# Patient Record
Sex: Female | Born: 1990 | Race: White | Hispanic: Yes | Smoking: Never smoker
Health system: Southern US, Community
[De-identification: ages and names within clinical notes are randomized; demographics above are authoritative.]

## PROBLEM LIST (undated history)

## (undated) DIAGNOSIS — E611 Iron deficiency: Secondary | ICD-10-CM

## (undated) DIAGNOSIS — J40 Bronchitis, not specified as acute or chronic: Secondary | ICD-10-CM

## (undated) DIAGNOSIS — H6993 Unspecified Eustachian tube disorder, bilateral: Secondary | ICD-10-CM

## (undated) DIAGNOSIS — L299 Pruritus, unspecified: Secondary | ICD-10-CM

## (undated) DIAGNOSIS — R5382 Chronic fatigue, unspecified: Secondary | ICD-10-CM

## (undated) DIAGNOSIS — F909 Attention-deficit hyperactivity disorder, unspecified type: Secondary | ICD-10-CM

## (undated) DIAGNOSIS — D899 Disorder involving the immune mechanism, unspecified: Secondary | ICD-10-CM

## (undated) DIAGNOSIS — M419 Scoliosis, unspecified: Secondary | ICD-10-CM

## (undated) DIAGNOSIS — G4700 Insomnia, unspecified: Secondary | ICD-10-CM

## (undated) DIAGNOSIS — I67848 Other cerebrovascular vasospasm and vasoconstriction: Secondary | ICD-10-CM

## (undated) DIAGNOSIS — Z789 Other specified health status: Secondary | ICD-10-CM

## (undated) DIAGNOSIS — K900 Celiac disease: Secondary | ICD-10-CM

## (undated) HISTORY — DX: Pruritus, unspecified: L29.9

## (undated) HISTORY — DX: Iron deficiency: E61.1

## (undated) HISTORY — DX: Insomnia, unspecified: G47.00

## (undated) HISTORY — DX: Other cerebrovascular vasospasm and vasoconstriction: I67.848

## (undated) HISTORY — DX: Attention-deficit hyperactivity disorder, unspecified type: F90.9

## (undated) HISTORY — DX: Chronic fatigue, unspecified: R53.82

## (undated) HISTORY — DX: Disorder involving the immune mechanism, unspecified: D89.9

## (undated) HISTORY — DX: Bronchitis, not specified as acute or chronic: J40

## (undated) HISTORY — DX: Other specified health status: Z78.9

## (undated) HISTORY — DX: Unspecified eustachian tube disorder, bilateral: H69.93

## (undated) HISTORY — DX: Scoliosis, unspecified: M41.9

## (undated) HISTORY — DX: Celiac disease: K90.0

---
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
## 2010-05-21 HISTORY — PX: WISDOM TOOTH EXTRACTION: SHX21

## 2018-04-01 ENCOUNTER — Encounter: Payer: Self-pay | Admitting: Internal Medicine

## 2018-04-01 ENCOUNTER — Ambulatory Visit (INDEPENDENT_AMBULATORY_CARE_PROVIDER_SITE_OTHER): Payer: 59 | Admitting: Internal Medicine

## 2018-04-01 VITALS — BP 128/60 | HR 89 | Ht 62.0 in | Wt 121.0 lb

## 2018-04-01 DIAGNOSIS — J45991 Cough variant asthma: Secondary | ICD-10-CM | POA: Diagnosis not present

## 2018-04-01 MED ORDER — PROMETHAZINE-CODEINE 6.25-10 MG/5ML PO SYRP: 5.0000 mL | ORAL_SOLUTION | ORAL | 0 refills | Status: DC | PRN

## 2018-04-01 MED ORDER — ACETAMINOPHEN-CODEINE #3 300-30 MG PO TABS: 1.0000 | ORAL_TABLET | ORAL | 0 refills | Status: DC | PRN

## 2018-04-01 MED ORDER — MOMETASONE FURO-FORMOTEROL FUM 100-5 MCG/ACT IN AERO: 2.0000 | INHALATION_SPRAY | Freq: Two times a day (BID) | RESPIRATORY_TRACT | 0 refills | Status: DC

## 2018-04-01 MED ORDER — FLUTTER DEVI: 0 refills | Status: DC

## 2018-04-01 MED ORDER — MOMETASONE FURO-FORMOTEROL FUM 100-5 MCG/ACT IN AERO: 2.0000 | INHALATION_SPRAY | Freq: Two times a day (BID) | RESPIRATORY_TRACT | 11 refills | Status: DC

## 2018-04-01 NOTE — Patient Instructions (Addendum)
For cough > mucinex dm 1200 mg max dose (or Delsym)  with flutter valve  If still can't stop and you can take 3 days off>>> promethazine with codeine   up to 1-2 tsp  every 4 hours to suppress the urge to cough. Swallowing water and/or using ice chips/non mint and menthol containing candies (such as lifesavers or sugarless jolly ranchers) are also effective.  You should rest your voice and avoid activities that you know make you cough.  Once you have eliminated the cough for 3 straight days stopping the promethazine and using delsym instead   Dulera 100 Take 2 puffs first thing in am and then another 2 puffs about 12 hours later   GERD (REFLUX)  is an extremely common cause of respiratory symptoms just like yours , many times with no obvious heartburn at all.    It can be treated with medication, but also with lifestyle changes including elevation of the head of your bed (ideally with 6 inch  bed blocks),  Smoking cessation, avoidance of late meals, excessive alcohol, and avoid fatty foods, chocolate, peppermint, colas, red wine, and acidic juices such as orange juice.  NO MINT OR MENTHOL PRODUCTS SO NO COUGH DROPS   USE SUGARLESS CANDY INSTEAD (Jolley ranchers or Stover's or Life Savers) or even ice chips will also do - the key is to swallow to prevent all throat clearing. NO OIL BASED VITAMINS - use powdered substitutes.   Please schedule a follow up office visit in 2 weeks, sooner if needed  with all medications /inhalers/ solutions in hand so we can verify exactly what you are taking. This includes all medications from all doctors and over the counters
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
## 2018-04-15 ENCOUNTER — Encounter: Payer: Self-pay | Admitting: Internal Medicine

## 2018-04-15 ENCOUNTER — Ambulatory Visit (INDEPENDENT_AMBULATORY_CARE_PROVIDER_SITE_OTHER): Payer: 59 | Admitting: Internal Medicine

## 2018-04-15 VITALS — BP 116/70 | HR 72 | Ht 62.0 in | Wt 120.2 lb

## 2018-04-15 DIAGNOSIS — J45991 Cough variant asthma: Secondary | ICD-10-CM

## 2018-04-15 NOTE — Patient Instructions (Addendum)
Come to Outpatient Registration at [facility] (behind the ER) at 79 am Tuesday  Dec 3rd with nothing to eat or drink after midnight Monday  for Bronchoscopy  Add:  Check next ov re use of codeine cough meds ? 3 day cycle completed?
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
## 2018-04-16 ENCOUNTER — Encounter: Payer: Self-pay | Admitting: Internal Medicine

## 2018-04-16 ENCOUNTER — Telehealth: Payer: Self-pay | Admitting: Internal Medicine

## 2018-04-16 NOTE — Telephone Encounter (Signed)
Called patient, unable to reach left message to give us a call back. 

## 2018-04-16 NOTE — Telephone Encounter (Signed)
Called and spoke with Baxter Flattery, she stated that patient has decided to cancel the bronch as she has no transportation. Patient did not want to reschedule at this time. Will route to MW as an Pharmacist, hospital.
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
## 2018-04-16 NOTE — Telephone Encounter (Signed)
Let pt know that if that's the case I'm happy to finish the work up with sinus CT and HRCT chest but if declines this then refer to [place] allergy eval and we'll see back here prn with all active meds in hand

## 2018-04-18 NOTE — Telephone Encounter (Signed)
Attempted to call pt but line went straight to VM. Left message for pt to return call.

## 2018-04-21 NOTE — Telephone Encounter (Signed)
Attempted to call pt but unable to reach her and unable to leave a VM due to mailbox being full. Will try to cal back later.

## 2018-04-21 NOTE — Telephone Encounter (Signed)
Patient returned call, CB is [phone]

## 2018-04-21 NOTE — Telephone Encounter (Signed)
atc pt, mailbox full.  Wcb.

## 2018-04-22 ENCOUNTER — Ambulatory Visit (HOSPITAL_COMMUNITY)
Admission: RE | Admit: 2018-04-22 | Payer: BLUE CROSS/BLUE SHIELD | Source: Ambulatory Visit | Admitting: Internal Medicine

## 2018-04-22 ENCOUNTER — Encounter (HOSPITAL_COMMUNITY): Payer: BLUE CROSS/BLUE SHIELD

## 2018-04-22 ENCOUNTER — Encounter (HOSPITAL_COMMUNITY): Admission: RE | Payer: Self-pay | Source: Ambulatory Visit

## 2018-04-22 SURGERY — VIDEO BRONCHOSCOPY WITHOUT FLUORO
Anesthesia: Moderate Sedation | Laterality: Bilateral

## 2018-04-22 NOTE — Telephone Encounter (Signed)
Spoke with patient-she states she told our office already that she did not want to cancel the bronch all together-she would just need to [facility] it when she had family in town so they could take her. Pt understands to contact our office once she has family in town and we will work with her to get bronch scheduled. Pt will wait to due CT Sinus and HRCT Chest until after bronch if still needed. Pt is aware that I will send signed message to MW as Juluis Rainier.

## 2019-02-16 ENCOUNTER — Other Ambulatory Visit: Payer: Self-pay

## 2019-02-16 DIAGNOSIS — Z20822 Contact with and (suspected) exposure to covid-19: Secondary | ICD-10-CM

## 2019-02-17 LAB — NOVEL CORONAVIRUS, NAA: SARS-CoV-2, NAA: NOT DETECTED

## 2019-05-20 ENCOUNTER — Other Ambulatory Visit: Payer: 59

## 2019-07-01 ENCOUNTER — Ambulatory Visit: Payer: 59 | Attending: Internal Medicine

## 2019-07-01 ENCOUNTER — Ambulatory Visit: Payer: 59

## 2019-07-01 DIAGNOSIS — Z20822 Contact with and (suspected) exposure to covid-19: Secondary | ICD-10-CM

## 2019-07-02 LAB — NOVEL CORONAVIRUS, NAA: SARS-CoV-2, NAA: NOT DETECTED

## 2019-07-29 ENCOUNTER — Ambulatory Visit: Payer: 59 | Attending: Internal Medicine

## 2019-07-29 DIAGNOSIS — Z20822 Contact with and (suspected) exposure to covid-19: Secondary | ICD-10-CM

## 2019-07-30 LAB — NOVEL CORONAVIRUS, NAA: SARS-CoV-2, NAA: NOT DETECTED

## 2019-08-12 ENCOUNTER — Ambulatory Visit: Payer: 59

## 2019-08-12 ENCOUNTER — Ambulatory Visit: Payer: 59 | Attending: Internal Medicine

## 2019-08-12 DIAGNOSIS — Z20822 Contact with and (suspected) exposure to covid-19: Secondary | ICD-10-CM

## 2019-08-13 LAB — SARS-COV-2, NAA 2 DAY TAT

## 2019-08-13 LAB — NOVEL CORONAVIRUS, NAA: SARS-CoV-2, NAA: NOT DETECTED

## 2019-08-17 ENCOUNTER — Other Ambulatory Visit: Payer: Self-pay | Admitting: Family Medicine

## 2019-08-17 DIAGNOSIS — Z8639 Personal history of other endocrine, nutritional and metabolic disease: Secondary | ICD-10-CM

## 2019-09-01 ENCOUNTER — Other Ambulatory Visit: Payer: 59

## 2019-09-01 ENCOUNTER — Telehealth: Payer: Self-pay | Admitting: Hematology

## 2019-09-01 NOTE — Telephone Encounter (Signed)
Received a new hem referral from Christa See, NP at [facility] for evaluate for iron infusions for low ferritin level. MS. [place]ust has been cld and scheduled to see Dr. Irene Limbo on 5/4 at 1pm. Pt aware to arrive 15 minutes early.

## 2019-09-01 NOTE — Telephone Encounter (Signed)
Created in error

## 2019-09-07 ENCOUNTER — Other Ambulatory Visit: Payer: 59

## 2019-09-08 ENCOUNTER — Ambulatory Visit
Admission: RE | Admit: 2019-09-08 | Discharge: 2019-09-08 | Disposition: A | Discharge status: Home / Self Care | Payer: 59 | Source: Ambulatory Visit | Attending: Family Medicine | Admitting: Family Medicine

## 2019-09-08 DIAGNOSIS — Z8639 Personal history of other endocrine, nutritional and metabolic disease: Secondary | ICD-10-CM

## 2019-09-16 ENCOUNTER — Encounter: Payer: Self-pay | Admitting: *Deleted

## 2019-09-16 ENCOUNTER — Other Ambulatory Visit: Payer: Self-pay | Admitting: *Deleted

## 2019-09-21 ENCOUNTER — Ambulatory Visit: Payer: 59 | Admitting: Diagnostic Neuroimaging

## 2019-09-21 ENCOUNTER — Other Ambulatory Visit: Payer: Self-pay

## 2019-09-21 ENCOUNTER — Encounter: Payer: Self-pay | Admitting: *Deleted

## 2019-09-21 VITALS — BP 104/67 | HR 84 | Temp 97.5°F | Ht 62.0 in | Wt 129.0 lb

## 2019-09-21 DIAGNOSIS — I679 Cerebrovascular disease, unspecified: Secondary | ICD-10-CM

## 2019-09-21 DIAGNOSIS — I6781 Acute cerebrovascular insufficiency: Secondary | ICD-10-CM | POA: Diagnosis not present

## 2019-09-21 NOTE — Progress Notes (Signed)
[facility]  PATIENT: Lori Mccarty DOB: April 29, 1991  REFERRING CLINICIAN: Christa See, FNP HISTORY FROM: patient  REASON FOR VISIT: new consult    HISTORICAL  CHIEF COMPLAINT:  Chief Complaint  Patient presents with  . 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  She is seen a number of academic neurology specialists and had extensive work-up thus far.  Patient now living in [place] and looking to establish with a local neurologist.  2006 patient had a head injury and concussion during cheerleading event where she was struck in the head by another teammate.  Patient lost consciousness, had dizziness, weakness, pain.  She developed cognitive difficulties, anxiety, fatigue and pain.  In spite of these difficulties, patient had to relearn and overcome the symptoms.  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  Around 2017 patient started to have new types of symptoms where she would have sudden fatigue and generalized weakness, difficulty walking, typically triggered by exertion, head position, body position, alcohol, eating.  Her systemic blood pressure would not change that much during these events.  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  She was treated conservatively with increased fluid intake, salt, compression stockings, trial of Mestinon which she could not tolerate, trial of Florinef which she takes intermittently now.  She also had MRI, MRA of the head and neck which were normal.   Skin biopsy demonstrated possible small fiber neuropathy.  EMG was normal.  EEG was normal.  Patient also having trouble with her sleep cycle which diagnosed as inverted.  She is tried variety of sleep aids including Ambien, trazodone, Cymbalta, Lyrica, buspirone, benzodiazepines without relief.  Currently on trazodone.  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    REVIEW OF SYSTEMS: Full 14 system review of systems performed and negative with exception of: Insomnia sleepiness restlessness tremor anxiety not of sleep allergy sensitivity diarrhea constipation ringing ears spinning sensation chest pain palpitation murmur weight gain fatigue feeling hot feeling cold flushing rash.  ALLERGIES: Allergies  Allergen Reactions  . Sulfa Antibiotics Swelling    Swelling around eye/tempral area  . Dulera [Mometasone Furo-Formoterol Fum]     Shaky feeling, almost jittery 10 minutes after taking.  . Gluten Meal Other (See Comments)    celiac disease  . Mestinon [Pyridostigmine] Other (See Comments)    GI issues  . Penicillins Rash    Has patient had a PCN reaction causing immediate rash, facial/tongue/throat swelling, SOB or lightheadedness with hypotension: Yes Has patient had a PCN reaction causing severe rash involving mucus membranes or skin necrosis: No Has patient had a PCN reaction that required hospitalization: No Has patient had a PCN reaction occurring within the last 10 years: No Infantile reaction. If all of the above answers are "NO", then may proceed with Cephalosporin use.     HOME MEDICATIONS: Outpatient Medications Prior to Visit  Medication Sig Dispense Refill  . albuterol (PROVENTIL HFA;VENTOLIN HFA) 108 (90 Base) MCG/ACT inhaler Inhale 2 puffs into the lungs every 6 (six) hours as needed for wheezing or shortness of breath.    Marland Kitchen  B Complex-C (B-COMPLEX WITH VITAMIN C) tablet Take 1 tablet by mouth 3 (three) times a week.      . budesonide-formoterol (SYMBICORT) 80-4.5 MCG/ACT inhaler Inhale into the lungs as needed.    . carboxymethylcellulose (REFRESH PLUS) 0.5 % SOLN Place 1 drop into both eyes 3 (three) times daily as needed (dry/irritated eyes.).    Marland Kitchen Cholecalciferol (VITAMIN D3) 125 MCG (5000 UT) TABS Take 5,000 Units by mouth 3 (three) times a week.    . fludrocortisone (FLORINEF) 0.1 MG tablet 1 tablet daily.    . hydroxychloroquine (PLAQUENIL) 200 MG tablet as needed. 09/21/19 not taking    . Iron-FA-B Cmp-C-Biot-Probiotic (FUSION PLUS) CAPS Take 1 capsule by mouth daily.    Marland Kitchen Respiratory Therapy Supplies (FLUTTER) DEVI Use as directed 1 each 0  . traZODone (DESYREL) 50 MG tablet Take 100 mg by mouth at bedtime.     Marland Kitchen ivermectin (STROMECTOL) 3 MG TABS tablet as needed.    . metroNIDAZOLE (METROGEL) 0.75 % vaginal gel     . MELATONIN PO Take by mouth.    . MELATONIN-GABA-VALERIAN PO Take 1 capsule by mouth at bedtime as needed (sleep).     No facility-administered medications prior to visit.    PAST MEDICAL HISTORY: Past Medical History:  Diagnosis Date  . Celiac disease   . Cerebral vasospasm   . Chronic fatigue   . Eustachian tube disorder, bilateral   . Hyperkinetic    gall bladder  . 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 Insomnia   . Iron deficiency   . Pruritic condition   . Scoliosis    from injury  . TBI (traumatic brain injury) [facility]) 2006   cheerleading accident  . Vegetarian diet     PAST SURGICAL HISTORY: Past Surgical History:  Procedure Laterality Date  . WISDOM TOOTH EXTRACTION  2012    FAMILY HISTORY: Family History  Problem Relation Age of Onset  . Hypertension Father   . CAD Maternal Grandmother   . Lung cancer Maternal Grandmother   . Bladder Cancer Maternal Grandmother   . Diabetes Maternal Grandmother   . Parkinson's disease Maternal Grandmother   . Colon cancer Maternal Grandfather   . Heart attack Maternal Grandfather   . Hypertension  Maternal Grandfather   . Dementia Paternal Grandmother     SOCIAL HISTORY: Social History   Socioeconomic History  . Marital status: Single    Spouse name: Not on file  . Number of children: 0  . Years of education: Not on file  . Highest education level: Professional school degree (e.g., MD, DDS, DVM, JD)  Occupational History    Comment: Vet  Tobacco Use  . Smoking status: Never Smoker  . Smokeless tobacco: Never Used  Substance and Sexual Activity  . Alcohol use: Yes    Comment: 2/weekly  . Drug use: Not Currently  . Sexual activity: Not on file  Other Topics Concern  . Not on file  Social History Narrative   Lives with partner   No caffeine   Social Determinants of Health   Financial Resource Strain:   . Difficulty of Paying Living Expenses:   Food Insecurity:   . Worried About Charity fundraiser in the Last Year:   . Arboriculturist in the Last Year:   Transportation Needs:   . Film/video editor (Medical):   Marland Kitchen Lack of Transportation (Non-Medical):   Physical Activity:   . Days of Exercise per Week:   .  Minutes of Exercise per Session:   Stress:   . Feeling of Stress :   Social Connections:   . Frequency of Communication with Friends and Family:   . Frequency of Social Gatherings with Friends and Family:   . Attends Religious Services:   . Active Member of Clubs or Organizations:   . Attends Archivist Meetings:   Marland Kitchen Marital Status:   Intimate Partner Violence:   . Fear of Current or Ex-Partner:   . Emotionally Abused:   Marland Kitchen Physically Abused:   . Sexually Abused:      PHYSICAL EXAM  GENERAL EXAM/CONSTITUTIONAL: Vitals:  Vitals:   09/21/19 1125  BP: 104/67  Pulse: 84  Temp: (!) 97.5 F (36.4 C)  Weight: 129 lb (58.5 kg)  Height: 5' 2"  (1.575 m)     Body mass index is 23.59 kg/m. Wt Readings from Last 3 Encounters:  09/21/19 129 lb (58.5 kg)  04/15/18 120 lb 3.2 oz (54.5 kg)  04/01/18 121 lb (54.9 kg)     Patient is in  no distress; well developed, nourished and groomed; neck is supple  CARDIOVASCULAR:  Examination of carotid arteries is normal; no carotid bruits  Regular rate and rhythm, no murmurs  Examination of peripheral vascular system by observation and palpation is normal  EYES:  Ophthalmoscopic exam of optic discs and posterior segments is normal; no papilledema or hemorrhages  No exam data present  MUSCULOSKELETAL:  Gait, strength, tone, movements noted in Neurologic exam below  NEUROLOGIC: MENTAL STATUS:  No flowsheet data found.  awake, alert, oriented to person, place and time  recent and remote memory intact  normal attention and concentration  language fluent, comprehension intact, naming intact  fund of knowledge appropriate  CRANIAL NERVE:   2nd - no papilledema on fundoscopic exam  2nd, 3rd, 4th, 6th - pupils equal and reactive to light, visual fields full to confrontation, extraocular muscles intact, no nystagmus  5th - facial sensation symmetric  7th - facial strength symmetric  8th - hearing intact  9th - palate elevates symmetrically, uvula midline  11th - shoulder shrug symmetric  12th - tongue protrusion midline  MOTOR:   normal bulk and tone, full strength in the BUE, BLE  SENSORY:   normal and symmetric to light touch, temperature, vibration  COORDINATION:   finger-nose-finger, fine finger movements normal  REFLEXES:   deep tendon reflexes present and symmetric  GAIT/STATION:   narrow based gait     DIAGNOSTIC DATA (LABS, IMAGING, TESTING) - I reviewed patient records, labs, notes, testing and imaging myself where available.  No results found for: WBC, HGB, HCT, MCV, PLT No results found for: NA, K, CL, CO2, GLUCOSE, BUN, CREATININE, CALCIUM, PROT, ALBUMIN, AST, ALT, ALKPHOS, BILITOT, GFRNONAA, GFRAA No results found for: CHOL, HDL, LDLCALC, LDLDIRECT, TRIG, CHOLHDL No results found for: HGBA1C No results found for:  VITAMINB12 No results found for: TSH   05/26/15 MRI cervical spine  1. STRAIGHTENING OF THE CERVICAL LORDOSIS. 2. OTHERWISE NEGATIVE EVALUATION. NO HERNIATED DISC, SPINAL STENOSIS, INTRINSIC CORD ABNORMALITY OR BONY LESIONS ARE PRESENT AT ANY LEVEL.  06/08/16 MRI brain [report only] - mild frontal atrophy  09/28/16 MRA head / neck - normal  09/04/16 autonomic testing - qSART normal - heart rate response normal - head up tilt study normal - left MCA velocity decreased during tilt time; no change in systemic BP; right MCA normal  06/26/19 autonomic testing -Small fiber neuropathy, mild, affecting sudomotor autonomic fibers (epidermal nerve fiber density  was normal; sweat gland nerve fiber density mildly abnormal decreased by 11%) -Sympathetic adrenergic dysfunction; no evidence of orthostatic hypotension or POTS -Orthostatic cerebral blood flow velocities reduced, largely due to effect of hypocapnia, common and orthostatic intolerance syndromes -Intermittent hypocapnic hyperventilation in supine and during tilt.    ASSESSMENT AND PLAN  29 y.o. year old female here with intermittent episodes of dizziness, lightheadedness, fatigue, sleep disturbance.  Patient is seen several academic neurology specialists and been diagnosed with a cerebral hyperperfusion syndrome of unclear etiology.  Of note patient has had major concussion in 2006.  Dx:  1. Cerebral hypoperfusion      PLAN:  - check MRI brain (due to worsening symptoms) - consider sleep consult (inverted sleep cycle; insomnia)  Orders Placed This Encounter  Procedures  . MR BRAIN W WO CONTRAST   Return for pending if symptoms worsen or fail to improve.    Penni Bombard, MD 08/22/345, 42:59 AM Certified in Neurology, Neurophysiology and Neuroimaging  [facility] [address], [place],  [address] [phone]
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
## 2019-09-22 ENCOUNTER — Other Ambulatory Visit: Payer: Self-pay

## 2019-09-22 ENCOUNTER — Telehealth: Payer: Self-pay | Admitting: Hematology

## 2019-09-22 ENCOUNTER — Inpatient Hospital Stay: Payer: 59 | Attending: Hematology | Admitting: Hematology

## 2019-09-22 ENCOUNTER — Inpatient Hospital Stay: Payer: 59

## 2019-09-22 VITALS — BP 106/65 | HR 73 | Temp 99.1°F | Resp 18 | Ht 62.0 in | Wt 128.5 lb

## 2019-09-22 DIAGNOSIS — D509 Iron deficiency anemia, unspecified: Secondary | ICD-10-CM | POA: Diagnosis present

## 2019-09-22 DIAGNOSIS — Z801 Family history of malignant neoplasm of trachea, bronchus and lung: Secondary | ICD-10-CM | POA: Insufficient documentation

## 2019-09-22 DIAGNOSIS — Z862 Personal history of diseases of the blood and blood-forming organs and certain disorders involving the immune mechanism: Secondary | ICD-10-CM | POA: Diagnosis not present

## 2019-09-22 DIAGNOSIS — Z8349 Family history of other endocrine, nutritional and metabolic diseases: Secondary | ICD-10-CM | POA: Insufficient documentation

## 2019-09-22 DIAGNOSIS — Z8052 Family history of malignant neoplasm of bladder: Secondary | ICD-10-CM | POA: Diagnosis not present

## 2019-09-22 DIAGNOSIS — Z800 Family history of malignant neoplasm of digestive organs: Secondary | ICD-10-CM | POA: Insufficient documentation

## 2019-09-22 LAB — CMP (CANCER CENTER ONLY)
ALT: 15 U/L (ref 0–44)
AST: 21 U/L (ref 15–41)
Albumin: 4.3 g/dL (ref 3.5–5.0)
Alkaline Phosphatase: 66 U/L (ref 38–126)
Anion gap: 8 (ref 5–15)
BUN: 7 mg/dL (ref 6–20)
CO2: 22 mmol/L (ref 22–32)
Calcium: 8.8 mg/dL — ABNORMAL LOW (ref 8.9–10.3)
Chloride: 108 mmol/L (ref 98–111)
Creatinine: 0.75 mg/dL (ref 0.44–1.00)
GFR, Est AFR Am: >60 mL/min (ref >60)
GFR, Estimated: >60 mL/min (ref >60)
Glucose, Bld: 82 mg/dL (ref 70–99)
Potassium: 4 mmol/L (ref 3.5–5.1)
Sodium: 138 mmol/L (ref 135–145)
Total Bilirubin: 0.6 mg/dL (ref 0.3–1.2)
Total Protein: 7.2 g/dL (ref 6.5–8.1)

## 2019-09-22 LAB — IRON AND TIBC
Iron: 83 ug/dL (ref 41–142)
Saturation Ratios: 21 % (ref 21–57)
TIBC: 389 ug/dL (ref 236–444)
UIBC: 306 ug/dL (ref 120–384)

## 2019-09-22 LAB — CBC WITH DIFFERENTIAL/PLATELET
Abs Immature Granulocytes: 0.02 10*3/uL (ref 0.00–0.07)
Basophils Absolute: 0 10*3/uL (ref 0.0–0.1)
Basophils Relative: 0 %
Eosinophils Absolute: 0.2 10*3/uL (ref 0.0–0.5)
Eosinophils Relative: 2 %
HCT: 41.2 % (ref 36.0–46.0)
Hemoglobin: 13.6 g/dL (ref 12.0–15.0)
Immature Granulocytes: 0 %
Lymphocytes Relative: 25 %
Lymphs Abs: 2.1 10*3/uL (ref 0.7–4.0)
MCH: 29.9 pg (ref 26.0–34.0)
MCHC: 33 g/dL (ref 30.0–36.0)
MCV: 90.5 fL (ref 80.0–100.0)
Monocytes Absolute: 0.8 10*3/uL (ref 0.1–1.0)
Monocytes Relative: 9 %
Neutro Abs: 5.2 10*3/uL (ref 1.7–7.7)
Neutrophils Relative %: 64 %
Platelets: 272 10*3/uL (ref 150–400)
RBC: 4.55 MIL/uL (ref 3.87–5.11)
RDW: 13.2 % (ref 11.5–15.5)
WBC: 8.2 10*3/uL (ref 4.0–10.5)
nRBC: 0 % (ref 0.0–0.2)

## 2019-09-22 LAB — FERRITIN: Ferritin: 13 ng/mL (ref 11–307)

## 2019-09-22 LAB — VITAMIN B12: Vitamin B-12: 365 pg/mL (ref 180–914)

## 2019-09-22 NOTE — Telephone Encounter (Signed)
Scheduled per los. Patient declined printout

## 2019-09-23 ENCOUNTER — Ambulatory Visit: Payer: 59 | Attending: Internal Medicine

## 2019-09-23 DIAGNOSIS — Z20822 Contact with and (suspected) exposure to covid-19: Secondary | ICD-10-CM

## 2019-09-23 LAB — INTRINSIC FACTOR ANTIBODIES: Intrinsic Factor: 0.9 AU/mL (ref 0.0–1.1)

## 2019-09-24 LAB — ANTI-PARIETAL ANTIBODY: Parietal Cell Antibody-IgG: 8.7 Units (ref 0.0–20.0)

## 2019-09-24 LAB — SARS-COV-2, NAA 2 DAY TAT

## 2019-09-24 LAB — NOVEL CORONAVIRUS, NAA: SARS-CoV-2, NAA: NOT DETECTED

## 2019-09-28 ENCOUNTER — Telehealth: Payer: Self-pay | Admitting: Diagnostic Neuroimaging

## 2019-09-28 NOTE — Telephone Encounter (Signed)
spoke to the patient due to the cost she was going to thinkg about it and get back to me  [place]: [id number] (exp 09/23/19-11/07/19).

## 2019-09-30 ENCOUNTER — Other Ambulatory Visit: Payer: Self-pay | Admitting: *Deleted

## 2019-09-30 ENCOUNTER — Telehealth: Payer: Self-pay | Admitting: *Deleted

## 2019-09-30 DIAGNOSIS — D509 Iron deficiency anemia, unspecified: Secondary | ICD-10-CM

## 2019-09-30 NOTE — Telephone Encounter (Signed)
Contacted patient at Dr.Kale's request to inform her insurance will only approve IV Venofer, not Injectafer which is patient's preference. Patient states since taking po iron and her ferritin has almost doubled. Due to those results, she has decided not to get IV iron at this time. She requested appointment for IV iron be cancelled.

## 2019-10-05 ENCOUNTER — Ambulatory Visit: Payer: 59

## 2019-10-07 ENCOUNTER — Other Ambulatory Visit: Payer: Self-pay | Admitting: Family Medicine

## 2019-10-07 DIAGNOSIS — M2669 Other specified disorders of temporomandibular joint: Secondary | ICD-10-CM

## 2019-10-16 ENCOUNTER — Other Ambulatory Visit: Payer: Self-pay | Admitting: Family Medicine

## 2019-10-21 ENCOUNTER — Other Ambulatory Visit: Payer: Self-pay | Admitting: Family Medicine

## 2019-10-21 DIAGNOSIS — M2669 Other specified disorders of temporomandibular joint: Secondary | ICD-10-CM

## 2019-10-29 ENCOUNTER — Ambulatory Visit (INDEPENDENT_AMBULATORY_CARE_PROVIDER_SITE_OTHER): Payer: 59 | Admitting: Allergy and Immunology

## 2019-10-29 ENCOUNTER — Other Ambulatory Visit: Payer: Self-pay

## 2019-10-29 ENCOUNTER — Encounter: Payer: Self-pay | Admitting: Allergy and Immunology

## 2019-10-29 VITALS — BP 98/62 | HR 79 | Temp 98.7°F | Resp 16 | Ht 62.5 in | Wt 125.0 lb

## 2019-10-29 DIAGNOSIS — J45991 Cough variant asthma: Secondary | ICD-10-CM

## 2019-10-29 DIAGNOSIS — J3089 Other allergic rhinitis: Secondary | ICD-10-CM | POA: Diagnosis not present

## 2019-10-29 DIAGNOSIS — H1013 Acute atopic conjunctivitis, bilateral: Secondary | ICD-10-CM | POA: Diagnosis not present

## 2019-10-29 DIAGNOSIS — H1010 Acute atopic conjunctivitis, unspecified eye: Secondary | ICD-10-CM | POA: Insufficient documentation

## 2019-10-29 DIAGNOSIS — L500 Allergic urticaria: Secondary | ICD-10-CM

## 2019-10-29 DIAGNOSIS — K9049 Malabsorption due to intolerance, not elsewhere classified: Secondary | ICD-10-CM | POA: Diagnosis not present

## 2019-10-29 MED ORDER — LEVOCETIRIZINE DIHYDROCHLORIDE 5 MG PO TABS
5.0000 mg | ORAL_TABLET | Freq: Every day | ORAL | 5 refills | Status: DC | PRN
Start: 2019-10-29 — End: 2021-01-11

## 2019-10-29 MED ORDER — MONTELUKAST SODIUM 10 MG PO TABS
10.0000 mg | ORAL_TABLET | Freq: Every day | ORAL | 5 refills | Status: DC
Start: 2019-10-29 — End: 2021-01-11

## 2019-10-29 MED ORDER — OLOPATADINE HCL 0.2 % OP SOLN
1.0000 [drp] | Freq: Every day | OPHTHALMIC | 5 refills | Status: DC | PRN
Start: 2019-10-29 — End: 2021-01-11

## 2019-10-29 MED ORDER — AZELASTINE-FLUTICASONE 137-50 MCG/ACT NA SUSP: 1.0000 | Freq: Two times a day (BID) | NASAL | 5 refills | Status: DC | PRN

## 2019-10-29 NOTE — Patient Instructions (Addendum)
Intolerance, food Gastrointestinal symptoms, uncertain etiology. Skin tests to select food allergens were negative today. The negative predictive value of food allergen skin testing is excellent (approximately 95%). While this does not appear to be an IgE mediated issue, skin testing does not rule out food intolerances or cell-mediated enteropathies which may lend to GI symptoms. These etiologies are suggested when elimination of the responsible food leads to symptom resolution and re-introduction of the food is followed by the return of symptoms.   The patient has been encouraged to keep a careful symptom/food journal and eliminate any food suspected of correlating with symptoms. Should symptoms concerning for anaphylaxis arise, 911 is to be called immediately.  If GI symptoms persist or progress, further gastroenterologist evaluation may be warranted.  Perennial and seasonal allergic rhinitis  Aeroallergen avoidance measures have been discussed and provided in written form.  A prescription has been provided for levocetirizine (Xyzal), 5 mg daily as needed.  A prescription has been provided for azelastine/fluticasone nasal spray, 1 spray per nostril twice daily as needed. Proper nasal spray technique has been discussed and demonstrated.  Nasal saline spray (i.e., Simply Saline) or nasal saline lavage (i.e., NeilMed) is recommended as needed and prior to medicated nasal sprays.  The risks and benefits of aeroallergen immunotherapy have been discussed. The patient is interested in the possibility of initiating immunotherapy if insurance coverage is favorable. She will let us know how she would like to proceed.  Allergic conjunctivitis  Treatment plan as outlined above for allergic rhinitis.  A prescription has been provided for generic Pataday, one drop per eye daily as needed.  If insurance does not cover this medication, medicated allergy eyedrops may be purchased over-the-counter as Scientific laboratory technician.  I have also recommended eye lubricant drops (i.e., Natural Tears) as needed.  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  Aeroallergens skin test revealed robust reactivity to pollen, dust mite, and danders.  Skin tests to select food allergens were negative today. NSAIDs and emotional stress commonly exacerbate urticaria but are not the underlying etiology in this case.  There are no concomitant symptoms concerning for anaphylaxis or constitutional symptoms worrisome for an underlying malignancy.   We will not order labs at this time, however, if lesions recur, persist, progress, or change in character in the absence of pollen exposure, we will assess potential etiologies with screening labs.  For symptom relief, patient is to take oral antihistamines as directed.  Levocetirizine has been prescribed (as above).  A prescription has been provided for montelukast 10 mg daily at bedtime.  Should symptoms recur in the absence of overt Aeroallergen exposure, a journal is to be kept recording any foods eaten, beverages consumed, medications taken within a 6 hour period prior to the onset of symptoms, as well as record activities being performed, and environmental conditions. For any symptoms concerning for anaphylaxis, 911 is to be called immediately.  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  However, given symptom reduction with ICS/LABA, this favors cough variant asthma.  A methacholine challenge has been ordered.  Montelukast has been prescribed (as above).  If the cough persists or progresses, restart Symbicort 80-4.5 g, 2 inhalations via spacer device twice daily.  Subjective and objective measures of pulmonary function will be followed and the treatment plan will be adjusted  accordingly.   Return in about 4 months (  around 02/28/2020), or if symptoms worsen or fail to improve.  Control of Dust Mite Allergen  House dust mites play a major role in allergic asthma and rhinitis.  They occur in environments with high humidity wherever human skin, the food for dust mites is found. High levels have been detected in dust obtained from mattresses, pillows, carpets, upholstered furniture, bed covers, clothes and soft toys.  The principal allergen of the house dust mite is found in its feces.  A gram of dust may contain 1,000 mites and 250,000 fecal particles.  Mite antigen is easily measured in the air during house cleaning activities.    1. Encase mattresses, including the box spring, and pillow, in an air tight cover.  Seal the zipper end of the encased mattresses with wide adhesive tape. 2. Wash the bedding in water of 130 degrees Farenheit weekly.  Avoid cotton comforters/quilts and flannel bedding: the most ideal bed covering is the dacron comforter. 3. Remove all upholstered furniture from the bedroom. 4. Remove carpets, carpet padding, rugs, and non-washable window drapes from the bedroom.  Wash drapes weekly or use plastic window coverings. 5. Remove all non-washable stuffed toys from the bedroom.  Wash stuffed toys weekly. 6. Have the room cleaned frequently with a vacuum cleaner and a damp dust-mop.  The patient should not be in a room which is being cleaned and should wait 1 hour after cleaning before going into the room. 7. Close and seal all heating outlets in the bedroom.  Otherwise, the room will become filled with dust-laden air.  An electric heater can be used to heat the room. Reduce indoor humidity to less than 50%.  Do not use a humidifier.   Reducing Pollen Exposure  The American Academy of Allergy, Asthma and Immunology suggests the following steps to reduce your exposure to pollen during allergy seasons.    1. Do not hang sheets or clothing out to  dry; pollen may collect on these items. 2. Do not mow lawns or spend time around freshly cut grass; mowing stirs up pollen. 3. Keep windows closed at night.  Keep car windows closed while driving. 4. Minimize morning activities outdoors, a time when pollen counts are usually at their highest. 5. Stay indoors as much as possible when pollen counts or humidity is high and on windy days when pollen tends to remain in the air longer. 6. Use air conditioning when possible.  Many air conditioners have filters that trap the pollen spores. 7. Use a HEPA room air filter to remove pollen form the indoor air you breathe.   Control of Dog or Cat Allergen  Avoidance is the best way to manage a dog or cat allergy. If you have a dog or cat and are allergic to dog or cats, consider removing the dog or cat from the home. If you have a dog or cat but don't want to find it a new home, or if your family wants a pet even though someone in the household is allergic, here are some strategies that may help keep symptoms at bay:  1. Keep the pet out of your bedroom and restrict it to only a few rooms. Be advised that keeping the dog or cat in only one room will not limit the allergens to that room. 2. Don't pet, hug or kiss the dog or cat; if you do, wash your hands with soap and water. 3. High-efficiency particulate air (HEPA) cleaners run continuously in a bedroom or living room can  reduce allergen levels over time. 4. Place electrostatic material sheet in the air inlet vent in the bedroom. 5. Regular use of a high-efficiency vacuum cleaner or a central vacuum can reduce allergen levels. 6. Giving your dog or cat a bath at least once a week can reduce airborne allergen.

## 2019-10-29 NOTE — Assessment & Plan Note (Signed)
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  Aeroallergens skin test revealed robust reactivity to pollen, dust mite, and danders.  Skin tests to select food allergens were negative today. NSAIDs and emotional stress commonly exacerbate urticaria but are not the underlying etiology in this case.  There are no concomitant symptoms concerning for anaphylaxis or constitutional symptoms worrisome for an underlying malignancy.   We will not order labs at this time, however, if lesions recur, persist, progress, or change in character in the absence of pollen exposure, we will assess potential etiologies with screening labs.  For symptom relief, patient is to take oral antihistamines as directed.  Levocetirizine has been prescribed (as above).  A prescription has been provided for montelukast 10 mg daily at bedtime.  Should symptoms recur in the absence of overt Aeroallergen exposure, a journal is to be kept recording any foods eaten, beverages consumed, medications taken within a 6 hour period prior to the onset of symptoms, as well as record activities being performed, and environmental conditions. For any symptoms concerning for anaphylaxis, 911 is to be called immediately.

## 2019-10-29 NOTE — Assessment & Plan Note (Signed)
   Treatment plan as outlined above for allergic rhinitis.  A prescription has been provided for generic Pataday, one drop per eye daily as needed.  If insurance does not cover this medication, medicated allergy eyedrops may be purchased over-the-counter as Pataday Extra Strength or Zaditor.  I have also recommended eye lubricant drops (i.e., Natural Tears) as needed. 

## 2019-10-29 NOTE — Assessment & Plan Note (Signed)
   Aeroallergen avoidance measures have been discussed and provided in written form.  A prescription has been provided for levocetirizine (Xyzal), 5 mg daily as needed.  A prescription has been provided for azelastine/fluticasone nasal spray, 1 spray per nostril twice daily as needed. Proper nasal spray technique has been discussed and demonstrated.  Nasal saline spray (i.e., Simply Saline) or nasal saline lavage (i.e., NeilMed) is recommended as needed and prior to medicated nasal sprays.  The risks and benefits of aeroallergen immunotherapy have been discussed. The patient is interested in the possibility of initiating immunotherapy if insurance coverage is favorable. She will let us know how she would like to proceed.

## 2019-10-29 NOTE — Assessment & Plan Note (Signed)
Gastrointestinal symptoms, uncertain etiology. Skin tests to select food allergens were negative today. The negative predictive value of food allergen skin testing is excellent (approximately 95%). While this does not appear to be an IgE mediated issue, skin testing does not rule out food intolerances or cell-mediated enteropathies which may lend to GI symptoms. These etiologies are suggested when elimination of the responsible food leads to symptom resolution and re-introduction of the food is followed by the return of symptoms.   The patient has been encouraged to keep a careful symptom/food journal and eliminate any food suspected of correlating with symptoms. Should symptoms concerning for anaphylaxis arise, 911 is to be called immediately.  If GI symptoms persist or progress, further gastroenterologist evaluation may be warranted.
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
## 2019-10-29 NOTE — Assessment & Plan Note (Addendum)
The patient's history suggests cough variant asthma versus upper airway cough syndrome.  However, given symptom reduction with ICS/LABA, this favors cough variant asthma.  A methacholine challenge has been ordered.  Montelukast has been prescribed (as above).  If the cough persists or progresses, restart Symbicort 80-4.5 g, 2 inhalations via spacer device twice daily.  Subjective and objective measures of pulmonary function will be followed and the treatment plan will be adjusted accordingly.

## 2019-10-30 ENCOUNTER — Other Ambulatory Visit: Payer: Self-pay

## 2019-10-30 ENCOUNTER — Telehealth: Payer: Self-pay

## 2019-10-30 MED ORDER — FLUTICASONE PROPIONATE 50 MCG/ACT NA SUSP
2.0000 | Freq: Every day | NASAL | 5 refills | Status: DC
Start: 2019-10-30 — End: 2021-01-11

## 2019-10-30 MED ORDER — AZELASTINE HCL 0.1 % NA SOLN
NASAL | 5 refills | Status: DC
Start: 2019-10-30 — End: 2021-01-11

## 2019-10-30 NOTE — Telephone Encounter (Signed)
Pt is scheduled for a methacholine challenge at [facility] November 15 2019 at 2 pm pt needs to arrive at 145. No smoking no caffeine no inhalers 4 hours prior. pt is also scheduled to do covid testing Friday 11/13/2019 at [facility] between 8-3. Insurance does not require a prior approval for this challenge reference number for insurance [id number]. Pt is aware of all of this and stated her understanding.

## 2019-10-30 NOTE — Telephone Encounter (Signed)
Left pt. A message that her insurance does not cover the generic dymista so I sent fluticasone and azelastine in separately.

## 2019-11-02 ENCOUNTER — Telehealth: Payer: Self-pay | Admitting: Allergy and Immunology

## 2019-11-02 NOTE — Telephone Encounter (Signed)
PT called stating she still having hives from her skin testing on 6/10. States it is very uncomfortable and itchy. Has not had this level of reaction from previous skin tests. Pt only using benadryl and hydrocortisone cream. PT says hydroxyzine does not work for her and has not filled the xyzal. Forward call to nurse carrie

## 2019-11-02 NOTE — Telephone Encounter (Signed)
Spoke with dr Starling Manns he said pt needs to be taking xyzal, doing hydrocortisone 1% cream, and cold compresses that this can last a few weeks. I informed pt of this and she stated her understanding.

## 2019-11-13 ENCOUNTER — Other Ambulatory Visit (HOSPITAL_COMMUNITY)
Admission: RE | Admit: 2019-11-13 | Discharge: 2019-11-13 | Disposition: A | Discharge status: Home / Self Care | Payer: 59 | Source: Ambulatory Visit | Attending: Allergy and Immunology | Admitting: Allergy and Immunology

## 2019-11-13 ENCOUNTER — Telehealth: Payer: Self-pay

## 2019-11-13 DIAGNOSIS — Z01812 Encounter for preprocedural laboratory examination: Secondary | ICD-10-CM | POA: Diagnosis present

## 2019-11-13 DIAGNOSIS — Z20822 Contact with and (suspected) exposure to covid-19: Secondary | ICD-10-CM | POA: Diagnosis not present

## 2019-11-13 LAB — SARS CORONAVIRUS 2 (TAT 6-24 HRS): SARS Coronavirus 2: NEGATIVE

## 2019-11-13 NOTE — Telephone Encounter (Signed)
Weyandt, Golda Acre, MD 17 hours ago (4:11 PM)  VS Good afternoon!  Thank you again for your care and time. After an appointment with my functional med doctor, it was recommended to look into LDA. I am aware you do not do LDA and was wondering if it was because of any negative medical reasons? Is there anything I should know before making my decision between the two? Long term vs short term? I assume (hopefully) the effects on the T cells won't cause cancer growth or worsened immune issues. Please advise if you can!   Thank you so much for helping me navigate these complex issues.  [place]      Pt has been informed you are out of office and will  adress this once your back.

## 2019-11-16 ENCOUNTER — Ambulatory Visit (HOSPITAL_COMMUNITY)
Admission: RE | Admit: 2019-11-16 | Discharge: 2019-11-16 | Disposition: A | Discharge status: Home / Self Care | Payer: 59 | Source: Ambulatory Visit | Attending: Allergy and Immunology | Admitting: Allergy and Immunology

## 2019-11-16 ENCOUNTER — Other Ambulatory Visit: Payer: Self-pay

## 2019-11-16 DIAGNOSIS — J45991 Cough variant asthma: Secondary | ICD-10-CM | POA: Diagnosis present

## 2019-11-16 LAB — PULMONARY FUNCTION TEST
DL/VA % pred: 96 %
DL/VA: 4.5 ml/min/mmHg/L
DLCO unc % pred: 94 %
DLCO unc: 19.71 ml/min/mmHg
FEF 25-75 Post: 4.52 L/sec
FEF 25-75 Pre: 3.63 L/sec
FEF2575-%Change-Post: 24 %
FEF2575-%Pred-Post: 133 %
FEF2575-%Pred-Pre: 107 %
FEV1-%Change-Post: 4 %
FEV1-%Pred-Post: 115 %
FEV1-%Pred-Pre: 110 %
FEV1-Post: 3.48 L
FEV1-Pre: 3.33 L
FEV1FVC-%Change-Post: 6 %
FEV1FVC-%Pred-Pre: 101 %
FEV6-%Change-Post: -2 %
FEV6-%Pred-Post: 108 %
FEV6-%Pred-Pre: 110 %
FEV6-Post: 3.81 L
FEV6-Pre: 3.89 L
FEV6FVC-%Pred-Post: 100 %
FEV6FVC-%Pred-Pre: 100 %
FVC-%Change-Post: -2 %
FVC-%Pred-Post: 107 %
FVC-%Pred-Pre: 109 %
FVC-Post: 3.81 L
FVC-Pre: 3.89 L
Post FEV1/FVC ratio: 91 %
Post FEV6/FVC ratio: 100 %
Pre FEV1/FVC ratio: 86 %
Pre FEV6/FVC Ratio: 100 %
RV % pred: 114 %
RV: 1.44 L
TLC % pred: 105 %
TLC: 4.99 L

## 2019-11-16 MED ORDER — SODIUM CHLORIDE 0.9 % IN NEBU: 3.0000 mL | INHALATION_SOLUTION | Freq: Once | RESPIRATORY_TRACT | Status: DC

## 2019-11-16 MED ORDER — METHACHOLINE 0.25 MG/ML NEB SOLN: 2.0000 mL | Freq: Once | RESPIRATORY_TRACT | Status: DC

## 2019-11-16 MED ORDER — ALBUTEROL SULFATE (2.5 MG/3ML) 0.083% IN NEBU
2.5000 mg | INHALATION_SOLUTION | Freq: Once | RESPIRATORY_TRACT | Status: DC
  Administered 2019-11-16: 2.5 mg via RESPIRATORY_TRACT

## 2019-11-16 MED ORDER — METHACHOLINE 1 MG/ML NEB SOLN: 2.0000 mL | Freq: Once | RESPIRATORY_TRACT | Status: DC

## 2019-11-16 MED ORDER — METHACHOLINE 4 MG/ML NEB SOLN: 2.0000 mL | Freq: Once | RESPIRATORY_TRACT | Status: DC

## 2019-11-16 MED ORDER — METHACHOLINE 0.0625 MG/ML NEB SOLN: 2.0000 mL | Freq: Once | RESPIRATORY_TRACT | Status: DC

## 2019-11-16 MED ORDER — METHACHOLINE 16 MG/ML NEB SOLN: 2.0000 mL | Freq: Once | RESPIRATORY_TRACT | Status: DC

## 2019-11-16 NOTE — Telephone Encounter (Signed)
What does LDA stand for?  As I am unfamiliar with the acronym, I suspect that your functional medicine doctor would be better equipped to answer questions regarding this therapy than I am. Sorry that I cannot be of more help in this matter. Thanks.

## 2019-11-16 NOTE — Telephone Encounter (Signed)
I have not heard of this form of therapy prior to this discussion.  I'm sorry I am unable to provide information regarding this.

## 2019-11-16 NOTE — Telephone Encounter (Signed)
Lda per pt sands for low dose antigen therapy injections

## 2019-11-17 NOTE — Telephone Encounter (Signed)
Routed Dr. Barnett Hatter message back to pt via [place].

## 2019-12-11 ENCOUNTER — Other Ambulatory Visit: Payer: Self-pay | Admitting: Family Medicine

## 2019-12-11 DIAGNOSIS — N63 Unspecified lump in unspecified breast: Secondary | ICD-10-CM

## 2019-12-23 ENCOUNTER — Other Ambulatory Visit: Payer: 59

## 2019-12-23 ENCOUNTER — Other Ambulatory Visit: Payer: Self-pay

## 2019-12-23 ENCOUNTER — Inpatient Hospital Stay: Payer: 59 | Attending: Hematology | Admitting: Hematology

## 2019-12-23 VITALS — BP 102/63 | HR 80 | Temp 97.9°F | Resp 18 | Ht 62.5 in | Wt 126.1 lb

## 2019-12-23 DIAGNOSIS — D509 Iron deficiency anemia, unspecified: Secondary | ICD-10-CM | POA: Insufficient documentation

## 2019-12-23 DIAGNOSIS — Z862 Personal history of diseases of the blood and blood-forming organs and certain disorders involving the immune mechanism: Secondary | ICD-10-CM | POA: Insufficient documentation
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
## 2019-12-29 ENCOUNTER — Ambulatory Visit
Admission: RE | Admit: 2019-12-29 | Discharge: 2019-12-29 | Disposition: A | Discharge status: Home / Self Care | Payer: 59 | Source: Ambulatory Visit | Attending: Family Medicine | Admitting: Family Medicine

## 2019-12-29 ENCOUNTER — Other Ambulatory Visit: Payer: Self-pay

## 2019-12-29 DIAGNOSIS — N63 Unspecified lump in unspecified breast: Secondary | ICD-10-CM

## 2019-12-31 ENCOUNTER — Telehealth: Payer: Self-pay

## 2019-12-31 NOTE — Telephone Encounter (Signed)
[place] called me week before last stating Dr. Verlin Fester sent her to [facility] hospital for a test. She said she received a bill from them and her insurance didn't cover the test like they should. I told her she needs to reach out to  concerning the bill. She called me back last week stating she did talked with them and we need to change a code on our end for them to resubmit the test to her insurance so they will pay for it. I am not 100% sure what she is talking about since I don't do coding on the orders side of our practice. Can some call her and see what or if anything we can do on our end?   [phone]   Thank you.       Spoke with pt sh ehad a full pft done because we did not do one when she was in clinic. Methacholine challenge has nto been done do to a need for dx code change. Pt received a high bill and cone said reach out to [place] candice was not able to assist and routed to nurses. I spoke with pt she needs [place] to submit a form of dx change to hospital to they can change the code and try to get it covered as pt has a high bill now due to that. I informed pt I was not able to do that and it was above me. I told her I would get my office manager Johnette involved and she might be able to submit this form or figure out what the form is, also what other dx code is needed.

## 2020-05-11 ENCOUNTER — Encounter (HOSPITAL_COMMUNITY): Payer: Self-pay | Admitting: Emergency Medicine

## 2020-05-11 ENCOUNTER — Other Ambulatory Visit: Payer: Self-pay

## 2020-05-11 ENCOUNTER — Emergency Department (HOSPITAL_COMMUNITY)
Admission: EM | Admit: 2020-05-11 | Discharge: 2020-05-12 | Disposition: A | Discharge status: Home / Self Care | Payer: 59 | Attending: Emergency Medicine | Admitting: Emergency Medicine

## 2020-05-11 DIAGNOSIS — R112 Nausea with vomiting, unspecified: Secondary | ICD-10-CM | POA: Insufficient documentation

## 2020-05-11 DIAGNOSIS — Z5321 Procedure and treatment not carried out due to patient leaving prior to being seen by health care provider: Secondary | ICD-10-CM | POA: Diagnosis not present

## 2020-05-11 DIAGNOSIS — R109 Unspecified abdominal pain: Secondary | ICD-10-CM | POA: Insufficient documentation

## 2020-05-11 LAB — COMPREHENSIVE METABOLIC PANEL
ALT: 15 U/L (ref 0–44)
AST: 22 U/L (ref 15–41)
Albumin: 4.1 g/dL (ref 3.5–5.0)
Alkaline Phosphatase: 54 U/L (ref 38–126)
Anion gap: 11 (ref 5–15)
BUN: 11 mg/dL (ref 6–20)
CO2: 24 mmol/L (ref 22–32)
Calcium: 9 mg/dL (ref 8.9–10.3)
Chloride: 105 mmol/L (ref 98–111)
Creatinine, Ser: 0.7 mg/dL (ref 0.44–1.00)
GFR, Estimated: >60 mL/min (ref >60)
Glucose, Bld: 99 mg/dL (ref 70–99)
Potassium: 3.3 mmol/L — ABNORMAL LOW (ref 3.5–5.1)
Sodium: 140 mmol/L (ref 135–145)
Total Bilirubin: 0.7 mg/dL (ref 0.3–1.2)
Total Protein: 6.7 g/dL (ref 6.5–8.1)

## 2020-05-11 LAB — CBC
HCT: 42.7 % (ref 36.0–46.0)
Hemoglobin: 14.1 g/dL (ref 12.0–15.0)
MCH: 30.6 pg (ref 26.0–34.0)
MCHC: 33 g/dL (ref 30.0–36.0)
MCV: 92.6 fL (ref 80.0–100.0)
Platelets: 302 10*3/uL (ref 150–400)
RBC: 4.61 MIL/uL (ref 3.87–5.11)
RDW: 12.7 % (ref 11.5–15.5)
WBC: 19.8 10*3/uL — ABNORMAL HIGH (ref 4.0–10.5)
nRBC: 0 % (ref 0.0–0.2)

## 2020-05-11 LAB — URINALYSIS, ROUTINE W REFLEX MICROSCOPIC
Bacteria, UA: NONE SEEN
Bilirubin Urine: NEGATIVE
Glucose, UA: NEGATIVE mg/dL
Ketones, ur: 20 mg/dL — AB
Leukocytes,Ua: NEGATIVE
Nitrite: NEGATIVE
Protein, ur: NEGATIVE mg/dL
Specific Gravity, Urine: 1.017 (ref 1.005–1.030)
pH: 7 (ref 5.0–8.0)

## 2020-05-11 LAB — I-STAT BETA HCG BLOOD, ED (MC, WL, AP ONLY): I-stat hCG, quantitative: <5 m[IU]/mL (ref <5)

## 2020-05-11 LAB — LIPASE, BLOOD: Lipase: 25 U/L (ref 11–51)

## 2020-05-11 NOTE — ED Triage Notes (Signed)
Pt c/o abdominal pain, nausea, vomiting that started this afternoon. Denies diarrhea or urinary complaints.
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
## 2020-05-12 ENCOUNTER — Ambulatory Visit (INDEPENDENT_AMBULATORY_CARE_PROVIDER_SITE_OTHER): Payer: 59

## 2020-05-12 ENCOUNTER — Other Ambulatory Visit: Payer: Self-pay | Admitting: *Deleted

## 2020-05-12 DIAGNOSIS — R1011 Right upper quadrant pain: Secondary | ICD-10-CM

## 2020-05-12 DIAGNOSIS — R1084 Generalized abdominal pain: Secondary | ICD-10-CM

## 2020-05-21 DIAGNOSIS — A6920 Lyme disease, unspecified: Secondary | ICD-10-CM

## 2020-05-21 HISTORY — DX: Lyme disease, unspecified: A69.20

## 2020-06-06 ENCOUNTER — Telehealth: Payer: Self-pay | Admitting: Infectious Diseases

## 2020-06-06 ENCOUNTER — Other Ambulatory Visit: Payer: Self-pay | Admitting: Infectious Diseases

## 2020-06-06 DIAGNOSIS — U071 COVID-19: Secondary | ICD-10-CM

## 2020-06-06 NOTE — Progress Notes (Signed)
I connected by phone with Lori Mccarty on 06/06/2020 at 2:33 PM to discuss the potential use of a new treatment for mild to moderate COVID-19 viral infection in non-hospitalized patients.  This patient is a 30 y.o. female that meets the FDA criteria for Emergency Use Authorization of COVID monoclonal antibody sotrovimab.  Has a (+) direct SARS-CoV-2 viral test result  Has mild or moderate COVID-19   Is NOT hospitalized due to COVID-19  Is within 10 days of symptom onset  Has at least one of the high risk factor(s) for progression to severe COVID-19 and/or hospitalization as defined in EUA.  Specific high risk criteria : Chronic Lung Disease and Other high risk medical condition per CDC:  unvaccinated, bronchiectasis history   I have spoken and communicated the following to the patient or parent/caregiver regarding COVID monoclonal antibody treatment:  1. FDA has authorized the emergency use for the treatment of mild to moderate COVID-19 in adults and pediatric patients with positive results of direct SARS-CoV-2 viral testing who are 13 years of age and older weighing at least 40 kg, and who are at high risk for progressing to severe COVID-19 and/or hospitalization.  2. The significant known and potential risks and benefits of COVID monoclonal antibody, and the extent to which such potential risks and benefits are unknown.  3. Information on available alternative treatments and the risks and benefits of those alternatives, including clinical trials.  4. Patients treated with COVID monoclonal antibody should continue to self-isolate and use infection control measures (e.g., wear mask, isolate, social distance, avoid sharing personal items, clean and disinfect "high touch" surfaces, and frequent handwashing) according to CDC guidelines.   5. The patient or parent/caregiver has the option to accept or refuse COVID monoclonal antibody treatment.  After reviewing this information with the  patient, the patient has agreed to receive one of the available covid 19 monoclonal antibodies and will be provided an appropriate fact sheet prior to infusion. [name], NP 06/06/2020 2:33 PM

## 2020-06-06 NOTE — Telephone Encounter (Addendum)
Called to discuss with patient about COVID-19 symptoms and the use of one of the available treatments for those with mild to moderate Covid symptoms and at a high risk of hospitalization.  Pt appears to qualify for outpatient treatment due to co-morbid conditions and/or a member of an at-risk group in accordance with the FDA Emergency Use Authorization.     Symptom onset:06/01/20 - fevers, myalgias/arthralgas, sore throat, upper chest.  Vaccinated:  Booster?  Immunocompromised?  Qualifiers: SVI, asthma, frequent pneumonias, bronchiectasis.   Unable to reach pt - unable to leave voicemail. Mychart sent.     2:11 PM Patient called back after receiving mychart response. Day 6 today. Will schedule her for sotrovimab tomorrow.   Lori Mccarty

## 2020-06-07 ENCOUNTER — Other Ambulatory Visit (HOSPITAL_COMMUNITY): Payer: Self-pay

## 2020-06-07 ENCOUNTER — Ambulatory Visit (HOSPITAL_COMMUNITY)
Admission: RE | Admit: 2020-06-07 | Discharge: 2020-06-07 | Disposition: A | Discharge status: Home / Self Care | Payer: 59 | Source: Ambulatory Visit | Attending: Pulmonary Disease | Admitting: Pulmonary Disease

## 2020-06-07 DIAGNOSIS — J989 Respiratory disorder, unspecified: Secondary | ICD-10-CM | POA: Insufficient documentation

## 2020-06-07 DIAGNOSIS — U071 COVID-19: Secondary | ICD-10-CM | POA: Insufficient documentation

## 2020-06-07 MED ORDER — SOTROVIMAB 500 MG/8ML IV SOLN
500.0000 mg | Freq: Once | INTRAVENOUS | Status: AC
  Administered 2020-06-07: 500 mg via INTRAVENOUS

## 2020-06-07 MED ORDER — ALBUTEROL SULFATE HFA 108 (90 BASE) MCG/ACT IN AERS: 2.0000 | INHALATION_SPRAY | Freq: Once | RESPIRATORY_TRACT | Status: DC | PRN

## 2020-06-07 MED ORDER — METHYLPREDNISOLONE SODIUM SUCC 125 MG IJ SOLR: 125.0000 mg | Freq: Once | INTRAMUSCULAR | Status: DC | PRN

## 2020-06-07 MED ORDER — DIPHENHYDRAMINE HCL 50 MG/ML IJ SOLN: 50.0000 mg | Freq: Once | INTRAMUSCULAR | Status: DC | PRN

## 2020-06-07 MED ORDER — SODIUM CHLORIDE 0.9 % IV SOLN: INTRAVENOUS | Status: DC | PRN

## 2020-06-07 MED ORDER — FAMOTIDINE IN NACL 20-0.9 MG/50ML-% IV SOLN: 20.0000 mg | Freq: Once | INTRAVENOUS | Status: DC | PRN

## 2020-06-07 MED ORDER — EPINEPHRINE 0.3 MG/0.3ML IJ SOAJ: 0.3000 mg | Freq: Once | INTRAMUSCULAR | Status: DC | PRN

## 2020-06-07 NOTE — Progress Notes (Signed)
Diagnosis: COVID-19  Physician: Dr. Patrick Wright  Procedure: Covid Infusion Clinic Med: Sotrovimab infusion - Provided patient with sotrovimab fact sheet for patients, parents, and caregivers prior to infusion.   Complications: No immediate complications noted  Discharge: Discharged home    

## 2020-06-07 NOTE — Discharge Instructions (Signed)

## 2020-06-07 NOTE — Progress Notes (Signed)
Patient reviewed Fact Sheet for Patients, Parents, and Caregivers for Emergency Use Authorization (EUA) of Sotrovimab for the Treatment of Coronavirus. Patient also reviewed and is agreeable to the estimated cost of treatment. Patient is agreeable to proceed.   

## 2020-06-08 ENCOUNTER — Telehealth: Payer: Self-pay | Admitting: Family

## 2020-06-08 NOTE — Telephone Encounter (Signed)
Patient called to report perceived side effect from monoclonal antibody infusion 06/07/2020. Attempted to return call but phone went to voicemail and as voicemail box was full I am unable to leave a message. Will send MyChart message.   Loel Dubonnet, NP

## 2020-06-08 NOTE — Telephone Encounter (Signed)
Patient has spoken with [name], NP at this time and agreed to plan of care.   Loel Dubonnet, NP

## 2020-06-10 ENCOUNTER — Telehealth: Payer: Self-pay | Admitting: Allergy and Immunology

## 2020-06-10 ENCOUNTER — Ambulatory Visit (INDEPENDENT_AMBULATORY_CARE_PROVIDER_SITE_OTHER): Payer: 59 | Admitting: Medical

## 2020-06-10 VITALS — BP 110/78 | HR 90 | Temp 97.4°F | Resp 16 | Wt 121.1 lb

## 2020-06-10 DIAGNOSIS — U071 COVID-19: Secondary | ICD-10-CM | POA: Diagnosis not present

## 2020-06-10 DIAGNOSIS — R059 Cough, unspecified: Secondary | ICD-10-CM

## 2020-06-10 DIAGNOSIS — R42 Dizziness and giddiness: Secondary | ICD-10-CM

## 2020-06-10 DIAGNOSIS — T50905A Adverse effect of unspecified drugs, medicaments and biological substances, initial encounter: Secondary | ICD-10-CM

## 2020-06-10 DIAGNOSIS — R52 Pain, unspecified: Secondary | ICD-10-CM | POA: Diagnosis not present

## 2020-06-10 DIAGNOSIS — R251 Tremor, unspecified: Secondary | ICD-10-CM

## 2020-06-10 MED ORDER — HYDROCOD POLST-CPM POLST ER 10-8 MG/5ML PO SUER: 5.0000 mL | Freq: Two times a day (BID) | ORAL | 0 refills | Status: DC

## 2020-06-10 NOTE — Telephone Encounter (Signed)
I talked with this patient via phone. She reports that she was infected with covid with symptoms including chest congestion and somehow received sotrovimab infusion. She reports that about 3 hours after the infusion she began to experience dizziness, increased blood pressure, fatigue, cough, wheeze. Lightheadedness, shaking and tremor. She reports that she called the infusion clinic and received dexamethasone and a budesonide inhaler. She reports that her breathing has improved, however the other symptoms remain. I have advised her to go to urgent care or ED to be physically examined for her reported symptoms. She has agreed to this plan and will call back with any questions.

## 2020-06-10 NOTE — Telephone Encounter (Signed)
Pt had covid last week and got the antibodies  within 3 hrs after getting, could not stay awake, light headed. Coughing could worse with a wheeze, bp elevated by 15 points shakiness/ tremors. Been taking benadryl, dethomethozone, quercetin, Pepcid, and budesonide inhaler, she is at 72 hours mark and still having these issues.

## 2020-06-10 NOTE — Telephone Encounter (Signed)
PT HAD A REACTION TO MONOCLONAL ANTIBODY INFUSION STILL HAVING A REACTION 3 HOURS LATER, ASKING FOR ADVICE/ASSISTANCE ON WHAT TO DO.
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
## 2020-06-11 ENCOUNTER — Other Ambulatory Visit: Payer: Self-pay | Admitting: Medical

## 2020-06-11 ENCOUNTER — Ambulatory Visit (HOSPITAL_COMMUNITY)
Admission: RE | Admit: 2020-06-11 | Discharge: 2020-06-11 | Disposition: A | Discharge status: Home / Self Care | Payer: 59 | Source: Ambulatory Visit | Attending: Medical | Admitting: Medical

## 2020-06-11 DIAGNOSIS — U071 COVID-19: Secondary | ICD-10-CM | POA: Insufficient documentation

## 2020-06-11 DIAGNOSIS — R251 Tremor, unspecified: Secondary | ICD-10-CM | POA: Diagnosis present

## 2020-06-11 DIAGNOSIS — T50905A Adverse effect of unspecified drugs, medicaments and biological substances, initial encounter: Secondary | ICD-10-CM | POA: Diagnosis present

## 2020-06-11 DIAGNOSIS — R52 Pain, unspecified: Secondary | ICD-10-CM

## 2020-06-11 DIAGNOSIS — R42 Dizziness and giddiness: Secondary | ICD-10-CM | POA: Insufficient documentation

## 2020-06-11 DIAGNOSIS — R059 Cough, unspecified: Secondary | ICD-10-CM | POA: Insufficient documentation

## 2020-06-11 LAB — CBC WITH DIFFERENTIAL/PLATELET
Abs Immature Granulocytes: 0.04 10*3/uL (ref 0.00–0.07)
Basophils Absolute: 0 10*3/uL (ref 0.0–0.1)
Basophils Relative: 0 %
Eosinophils Absolute: 0.1 10*3/uL (ref 0.0–0.5)
Eosinophils Relative: 1 %
HCT: 41.1 % (ref 36.0–46.0)
Hemoglobin: 13.7 g/dL (ref 12.0–15.0)
Immature Granulocytes: 1 %
Lymphocytes Relative: 58 %
Lymphs Abs: 3.5 10*3/uL (ref 0.7–4.0)
MCH: 31.1 pg (ref 26.0–34.0)
MCHC: 33.3 g/dL (ref 30.0–36.0)
MCV: 93.2 fL (ref 80.0–100.0)
Monocytes Absolute: 0.6 10*3/uL (ref 0.1–1.0)
Monocytes Relative: 10 %
Neutro Abs: 1.8 10*3/uL (ref 1.7–7.7)
Neutrophils Relative %: 30 %
Platelets: 231 10*3/uL (ref 150–400)
RBC: 4.41 MIL/uL (ref 3.87–5.11)
RDW: 12.5 % (ref 11.5–15.5)
WBC: 6 10*3/uL (ref 4.0–10.5)
nRBC: 0 % (ref 0.0–0.2)

## 2020-06-11 LAB — COMPREHENSIVE METABOLIC PANEL
ALT: 15 U/L (ref 0–44)
AST: 19 U/L (ref 15–41)
Albumin: 4 g/dL (ref 3.5–5.0)
Alkaline Phosphatase: 52 U/L (ref 38–126)
Anion gap: 8 (ref 5–15)
BUN: 7 mg/dL (ref 6–20)
CO2: 25 mmol/L (ref 22–32)
Calcium: 8.8 mg/dL — ABNORMAL LOW (ref 8.9–10.3)
Chloride: 106 mmol/L (ref 98–111)
Creatinine, Ser: 0.75 mg/dL (ref 0.44–1.00)
Glucose, Bld: 83 mg/dL (ref 70–99)
Potassium: 3.3 mmol/L — ABNORMAL LOW (ref 3.5–5.1)
Sodium: 139 mmol/L (ref 135–145)
Total Bilirubin: 0.6 mg/dL (ref 0.3–1.2)
Total Protein: 6.7 g/dL (ref 6.5–8.1)

## 2020-06-11 LAB — SEDIMENTATION RATE: Sed Rate: 2 mm/hr (ref 0–22)

## 2020-06-11 LAB — LACTATE DEHYDROGENASE: LDH: 111 U/L (ref 98–192)

## 2020-06-11 LAB — C-REACTIVE PROTEIN: CRP: <0.5 mg/dL (ref <1.0)

## 2020-06-11 MED ORDER — PREDNISONE 10 MG PO TABS: ORAL_TABLET | ORAL | 0 refills | Status: DC

## 2020-06-11 MED ORDER — POTASSIUM CHLORIDE ER 8 MEQ PO TBCR: 8.0000 meq | EXTENDED_RELEASE_TABLET | Freq: Two times a day (BID) | ORAL | 0 refills | Status: DC

## 2020-06-21 ENCOUNTER — Other Ambulatory Visit: Payer: Self-pay | Admitting: Medical

## 2020-06-28 ENCOUNTER — Encounter: Payer: Self-pay | Admitting: Medical

## 2020-07-14 ENCOUNTER — Other Ambulatory Visit: Payer: 59

## 2020-08-15 ENCOUNTER — Telehealth: Payer: Self-pay | Admitting: Medical

## 2020-08-15 NOTE — Telephone Encounter (Signed)
Please schedule in person follow up regarding tremors, concerns.

## 2020-08-16 ENCOUNTER — Telehealth: Payer: Self-pay | Admitting: Medical

## 2020-08-16 NOTE — Telephone Encounter (Signed)
Please see her email.  Currently I am NOT listed as her PCP.  I would need to see her to eval and /or refer.  It would be inappropriate to continue giving advice at this point.  So either have her f/u with her listed PCP, or schedule to do a post covid follow up or tremor evaluation  If there are specific exam findings, then it would be easier to get MRI covered.  Insurance can be really difficult getting imaging covered

## 2020-08-17 NOTE — Telephone Encounter (Signed)
Message has been sent back to patient via mychart.

## 2020-08-23 NOTE — Telephone Encounter (Signed)
Patient has not scheduled appointment. A message was sent to patient on mychart about scheduling but she still has not done so although message was reviewed by patient.

## 2020-08-23 NOTE — Telephone Encounter (Signed)
Status update?

## 2020-11-10 ENCOUNTER — Other Ambulatory Visit: Payer: Self-pay | Admitting: Family Medicine

## 2020-11-10 DIAGNOSIS — R102 Pelvic and perineal pain: Secondary | ICD-10-CM

## 2020-11-16 ENCOUNTER — Other Ambulatory Visit: Payer: Self-pay | Admitting: Family Medicine

## 2020-11-16 DIAGNOSIS — R102 Pelvic and perineal pain: Secondary | ICD-10-CM

## 2020-11-24 ENCOUNTER — Other Ambulatory Visit: Payer: 59

## 2020-12-07 ENCOUNTER — Ambulatory Visit
Admission: RE | Admit: 2020-12-07 | Discharge: 2020-12-07 | Disposition: A | Discharge status: Home / Self Care | Payer: 59 | Source: Ambulatory Visit | Attending: Family Medicine | Admitting: Family Medicine

## 2020-12-07 DIAGNOSIS — R102 Pelvic and perineal pain: Secondary | ICD-10-CM

## 2020-12-08 ENCOUNTER — Other Ambulatory Visit: Payer: 59

## 2020-12-12 ENCOUNTER — Ambulatory Visit
Admission: RE | Admit: 2020-12-12 | Discharge: 2020-12-12 | Disposition: A | Discharge status: Home / Self Care | Payer: 59 | Source: Ambulatory Visit | Attending: Family Medicine | Admitting: Family Medicine

## 2020-12-12 ENCOUNTER — Other Ambulatory Visit: Payer: Self-pay | Admitting: Family Medicine

## 2020-12-12 DIAGNOSIS — R102 Pelvic and perineal pain: Secondary | ICD-10-CM

## 2020-12-15 ENCOUNTER — Encounter: Payer: Self-pay | Admitting: Hematology

## 2020-12-19 ENCOUNTER — Other Ambulatory Visit: Payer: 59

## 2020-12-26 ENCOUNTER — Other Ambulatory Visit: Payer: Self-pay | Admitting: *Deleted

## 2020-12-26 ENCOUNTER — Telehealth: Payer: Self-pay | Admitting: *Deleted

## 2020-12-26 DIAGNOSIS — D509 Iron deficiency anemia, unspecified: Secondary | ICD-10-CM

## 2020-12-26 NOTE — Telephone Encounter (Signed)
Patient called to f/u on mychart question and ask if she needs to be seen.  In Dr. Grier Mitts absence, patient inquiry given to Ms. Thayil, PA.  Per Ms. Thayil, patient can have lab appointment this week followed by appointment with her a day later this week and discuss concerns. Appointments offered to patient - she stated she felt ok at this time and was preparing to go out of town later this week. She stated she would rather wait and be scheduled with Dr. Irene Limbo on his return. Advised patient that appointment with Ms. Thayil would be this week and Dr. Grier Mitts next open appointment might not be for 2-3 weeks.  Patient states she really does feel ok and would prefer to wait see Dr. Irene Limbo and have lab appt a few days before. Lab tests ordered per Ms. Thayil: CBC;CMP;Iron-TIBC;Ferritin; Reticulocyte panel. Schedule message sent

## 2020-12-27 ENCOUNTER — Telehealth: Payer: Self-pay | Admitting: Hematology

## 2020-12-27 NOTE — Telephone Encounter (Signed)
Scheduled appts per 8/8 sch msg. Called pt, no answer and VM was full. Mailed updated calendar to pt.

## 2020-12-29 ENCOUNTER — Ambulatory Visit: Payer: 59 | Admitting: Cardiology

## 2021-01-11 ENCOUNTER — Ambulatory Visit (INDEPENDENT_AMBULATORY_CARE_PROVIDER_SITE_OTHER): Payer: 59 | Admitting: Infectious Diseases

## 2021-01-11 ENCOUNTER — Inpatient Hospital Stay: Payer: 59 | Attending: Hematology

## 2021-01-11 ENCOUNTER — Encounter: Payer: Self-pay | Admitting: Infectious Diseases

## 2021-01-11 ENCOUNTER — Other Ambulatory Visit: Payer: Self-pay

## 2021-01-11 VITALS — BP 115/81 | HR 80 | Temp 98.2°F | Ht 62.0 in | Wt 109.0 lb

## 2021-01-11 DIAGNOSIS — A6920 Lyme disease, unspecified: Secondary | ICD-10-CM

## 2021-01-11 DIAGNOSIS — D509 Iron deficiency anemia, unspecified: Secondary | ICD-10-CM | POA: Diagnosis present

## 2021-01-11 LAB — CBC WITH DIFFERENTIAL (CANCER CENTER ONLY)
Abs Immature Granulocytes: 0.02 10*3/uL (ref 0.00–0.07)
Basophils Absolute: 0 10*3/uL (ref 0.0–0.1)
Basophils Relative: 1 %
Eosinophils Absolute: 0.1 10*3/uL (ref 0.0–0.5)
Eosinophils Relative: 2 %
HCT: 41.6 % (ref 36.0–46.0)
Hemoglobin: 14.3 g/dL (ref 12.0–15.0)
Immature Granulocytes: 0 %
Lymphocytes Relative: 25 %
Lymphs Abs: 2.2 10*3/uL (ref 0.7–4.0)
MCH: 31.1 pg (ref 26.0–34.0)
MCHC: 34.4 g/dL (ref 30.0–36.0)
MCV: 90.4 fL (ref 80.0–100.0)
Monocytes Absolute: 0.6 10*3/uL (ref 0.1–1.0)
Monocytes Relative: 6 %
Neutro Abs: 5.9 10*3/uL (ref 1.7–7.7)
Neutrophils Relative %: 66 %
Platelet Count: 300 10*3/uL (ref 150–400)
RBC: 4.6 MIL/uL (ref 3.87–5.11)
RDW: 12.9 % (ref 11.5–15.5)
WBC Count: 8.9 10*3/uL (ref 4.0–10.5)
nRBC: 0 % (ref 0.0–0.2)

## 2021-01-11 LAB — RETIC PANEL
Immature Retic Fract: 6.4 % (ref 2.3–15.9)
RBC.: 4.64 MIL/uL (ref 3.87–5.11)
Retic Count, Absolute: 68.2 10*3/uL (ref 19.0–186.0)
Retic Ct Pct: 1.5 % (ref 0.4–3.1)
Reticulocyte Hemoglobin: 33.9 pg (ref >27.9)

## 2021-01-11 LAB — CMP (CANCER CENTER ONLY)
ALT: 13 U/L (ref 0–44)
AST: 15 U/L (ref 15–41)
Albumin: 4.4 g/dL (ref 3.5–5.0)
Alkaline Phosphatase: 72 U/L (ref 38–126)
Anion gap: 7 (ref 5–15)
BUN: 11 mg/dL (ref 6–20)
CO2: 24 mmol/L (ref 22–32)
Calcium: 9.4 mg/dL (ref 8.9–10.3)
Chloride: 108 mmol/L (ref 98–111)
Creatinine: 0.78 mg/dL (ref 0.44–1.00)
GFR, Estimated: >60 mL/min (ref >60)
Glucose, Bld: 87 mg/dL (ref 70–99)
Potassium: 4.2 mmol/L (ref 3.5–5.1)
Sodium: 139 mmol/L (ref 135–145)
Total Bilirubin: 0.5 mg/dL (ref 0.3–1.2)
Total Protein: 7.4 g/dL (ref 6.5–8.1)

## 2021-01-11 NOTE — Progress Notes (Signed)
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   She is referred to the office at the request of Christa See, FNP for evaluation of palpitations.  Patient states that she had a tick bite earlier in July 2022 and around July 13/14th she was noted to have a rash highly suggestive of Lyme's disease.  She went to urgent care and was started on doxycycline.  She was supposed to take doxycycline for 4 weeks but 10 days later was not able to tolerate the medication well.  She was recommended to be on another medication within the same drug class but chose not to.  She followed up with infectious disease and is currently undergoing the work-up of Lyme's disease.  Recently she started noticing night sweats, fatigue, dizziness, palpitations, and episodes to suggest near syncope.  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  In the past she had a traumatic brain injury and has undergone a cardiovascular work-up including a tilt table, echo, and Holter monitor.  Overall the work-up was negative.  Palpitations: Chronic but more prominent over the last several weeks.  Diagnosed with dysautonomia back in  2017 by neurology at an outside facility no additional records available for review.  Symptoms have been every couple days, lasting for less than 30 seconds, worse with postural changes, improves with coughing, no syncope.  And does not consume caffeinated beverages, energy drinks, stimulants, and no prior history of thyroid disease or anemia.   FUNCTIONAL STATUS: No structured exercise program or daily routine.    ALLERGIES: Allergies  Allergen Reactions   Tetracycline Hcl Nausea Only and Shortness Of Breath   Sulfa Antibiotics Swelling    Swelling around eye/tempral area   [facility] Furo-Formoterol Fum]     Shaky feeling, almost jittery 10 minutes after taking.   Gluten Meal Other (See Comments)    celiac disease   Mestinon [Pyridostigmine] Other (See Comments)    GI issues   Penicillins Rash    Has patient had a PCN reaction causing immediate rash, facial/tongue/throat swelling, SOB or lightheadedness with hypotension: Yes Has patient had a PCN reaction causing severe rash involving mucus membranes or skin necrosis: No Has patient had a PCN reaction that required hospitalization: No Has patient had a PCN reaction occurring within the last 10 years: No Infantile reaction. If all of the above answers are "NO", then may proceed with Cephalosporin use.     MEDICATION LIST PRIOR TO VISIT: Current Meds  Medication Sig   B Complex-C (B-COMPLEX WITH VITAMIN C) tablet Take 1 tablet by mouth 3 (three) times a week.   Cholecalciferol (VITAMIN D3) 125 MCG (5000 UT) TABS Take 5,000 Units by mouth 3 (three) times a  week.   SODIUM CHLORIDE PO Take by mouth daily at 12 noon.   vitamin B-12 (CYANOCOBALAMIN) 500 MCG tablet Take 500 mcg by mouth daily.   zolpidem (AMBIEN CR) 12.5 MG CR tablet Take 12.5 mg by mouth at bedtime.     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   Insomnia    Iron deficiency    Pruritic condition    Scoliosis    from injury   TBI (traumatic brain injury) ([place]) 2006   cheerleading accident   [place]     PAST SURGICAL HISTORY: Past Surgical History:  Procedure Laterality Date   WISDOM TOOTH EXTRACTION  2012    FAMILY HISTORY: The patient family history includes Allergic rhinitis in her father; Asthma in her mother; Bladder Cancer in her maternal grandmother; CAD in her maternal grandmother; Colon cancer in her maternal grandfather; Dementia in her paternal grandmother; Diabetes in her maternal grandmother; Food Allergy in her brother and mother; Heart attack in her maternal grandfather; Hypertension in her father and maternal grandfather; Lung cancer in her maternal grandmother; Parkinson's disease in her maternal grandmother.  SOCIAL HISTORY:  The patient  reports that she has never smoked. She has never used smokeless tobacco. She reports that she does not currently use alcohol. She reports that she does not currently use drugs.  REVIEW OF SYSTEMS: Review of Systems  Constitutional: Positive for malaise/fatigue. Negative for chills and fever.  HENT:  Negative for hoarse voice and nosebleeds.   Eyes:  Negative for discharge, double vision and pain.  Cardiovascular:  Positive for near-syncope and palpitations. Negative for chest pain, claudication, dyspnea on exertion, leg swelling, orthopnea, paroxysmal nocturnal dyspnea and syncope.  Respiratory:  Negative for hemoptysis and shortness of breath.   Musculoskeletal:  Negative for muscle cramps and myalgias.  Gastrointestinal:  Negative for abdominal pain, constipation, diarrhea, hematemesis, hematochezia, melena, nausea and vomiting.  Neurological:  Positive for dizziness. Negative for light-headedness.   PHYSICAL  EXAM: Vitals with BMI 01/12/2021 01/11/2021 06/10/2020  Height _0  _1  -  Weight 109 lbs 109 lbs 121 lbs 1 oz  BMI 70.96 28.36 -  Systolic 629 476 546  Diastolic 79 81 78  Pulse 83 80 90   Orthostatic VS for the past 72 hrs (Last 3 readings):  Orthostatic BP Patient Position BP Location Cuff Size Orthostatic Pulse  01/12/21 1110 109/74 Standing Left Arm Normal 96  01/12/21 1109 107/80 Sitting Left Arm Normal 80  01/12/21 1108 106/68 Supine Left Arm Normal 68     CONSTITUTIONAL: Well-developed and well-nourished. No acute distress.  SKIN: Skin is warm and dry. No rash noted. No cyanosis. No pallor. No jaundice HEAD: Normocephalic and atraumatic.  EYES: No scleral icterus MOUTH/THROAT: Moist oral membranes.  NECK: No JVD present. No thyromegaly noted. No carotid bruits  LYMPHATIC: No visible cervical adenopathy.  CHEST Normal respiratory effort. No intercostal retractions  LUNGS: Clear to auscultation bilaterally.  No stridor. No wheezes. No rales.  CARDIOVASCULAR: Regular rate and rhythm, positive S1-S2, no murmurs rubs or gallops ABDOMINAL: Soft, nontender, nondistended, positive bowel sounds in all 4 quadrants, no apparent ascites.  EXTREMITIES: No peripheral edema warm to touch, 2+ DP  and PT pulses. HEMATOLOGIC: No significant bruising NEUROLOGIC: Oriented to person, place, and time. Nonfocal. Normal muscle tone.  PSYCHIATRIC: Normal mood and affect. Normal behavior. Cooperative  CARDIAC DATABASE: EKG: 01/12/2021: Normal sinus rhythm, 79 bpm, without underlying ischemia or injury pattern.  Echocardiogram: No records available for review.   Stress Testing: No records available for review.  Heart Catheterization: None  LABORATORY DATA: CBC Latest Ref Rng & Units 01/11/2021 06/11/2020 05/11/2020  WBC 4.0 - 10.5 K/uL 8.9 6.0 19.8(H)  Hemoglobin 12.0 - 15.0 g/dL 14.3 13.7 14.1  Hematocrit 36.0 - 46.0 % 41.6 41.1 42.7  Platelets 150 - 400 K/uL 300 231 302    CMP Latest  Ref Rng & Units 01/11/2021 06/11/2020 05/11/2020  Glucose 70 - 99 mg/dL 87 83 99  BUN 6 - 20 mg/dL _0 Creatinine 0.44 - 1.00 mg/dL 0.78 0.75 0.70  Sodium 135 - 145 mmol/L 139 139 140  Potassium 3.5 - 5.1 mmol/L 4.2 3.3(L) 3.3(L)  Chloride 98 - 111 mmol/L 108 106 105  CO2 22 - 32 mmol/L _1 Calcium 8.9 - 10.3 mg/dL 9.4 8.8(L) 9.0  Total Protein 6.5 - 8.1 g/dL 7.4 6.7 6.7  Total Bilirubin 0.3 - 1.2 mg/dL 0.5 0.6 0.7  Alkaline Phos 38 - 126 U/L 72 52 54  AST 15 - 41 U/L _2 ALT 0 - 44 U/L _3 Lipid Panel  No results found for: CHOL, TRIG, HDL, CHOLHDL, VLDL, LDLCALC, LDLDIRECT, LABVLDL  No components found for: NTPROBNP No results for input(s): PROBNP in the last 8760 hours. No results for input(s): TSH in the last 8760 hours.  BMP Recent Labs    05/11/20 2205 06/11/20 0914 01/11/21 1523  NA 140 139 139  K 3.3* 3.3* 4.2  CL 105 106 108  CO2 _4 GLUCOSE 99 83 87  BUN _5 CREATININE 0.70 0.75 0.78  CALCIUM 9.0 8.8* 9.4  GFRNONAA >60 NOT CALCULATED >60  GFRAA  --  NOT CALCULATED  --     HEMOGLOBIN A1C No results found for: HGBA1C, MPG  External Labs:  Date Collected: 12/14/2020 , information obtained by Referring provider Serum cortisol 5.9 (within normal limits) WBC 8.5, platelets 260 K Potassium: 4.5 Creatinine 0.69 mg/dL. eGFR: 119 mL/min per 1.73 m Hemoglobin: 14.7 g/dL and hematocrit: 44.2 % AST: 15 , ALT: 8 , alkaline phosphatase: 55  TSH: 2.15    IMPRESSION:    ICD-10-CM   1. Palpitations  R00.2 EKG 12-Lead    LONG TERM MONITOR (3-14 DAYS)    2. Erythema migrans (Lyme disease)  A69.20 PCV ECHOCARDIOGRAM COMPLETE    C-reactive protein    Sedimentation rate    CBC       RECOMMENDATIONS: Johnella Crumm is a 30 y.o. 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    Palpitations: No identifiable reversible cause. No evidence of underlying thyroid disease or anemia. We will proceed with a 7-day extended Holter monitor. EKG nonischemic. Orthostatic vital signs negative.  Erythema migrans with working diagnosis of Lyme's disease: Will do further work-up of Lyme disease and treatment infectious disease. Will check CBC to evaluate WBC count, ESR and CRP as far as inflammatory markers. Echocardiogram to evaluate for pericardial disease/pericardial effusion.  Thank you for allowing [place] to participate in the care of Dr. Jordan Hawks  [place] please reach out if any questions or concerns arise.  FINAL MEDICATION LIST END OF ENCOUNTER: No orders of the defined types were placed in this encounter.   Medications Discontinued During This Encounter  Medication Reason   predniSONE (DELTASONE) 10 MG tablet Error   fludrocortisone (FLORINEF) 0.1 MG tablet Error     Current Outpatient Medications:    B Complex-C (B-COMPLEX WITH VITAMIN C) tablet, Take 1 tablet by mouth 3 (three) times a week., Disp: , Rfl:    Cholecalciferol (VITAMIN D3) 125 MCG (5000 UT) TABS, Take 5,000 Units by mouth 3 (three) times a week., Disp: , Rfl:    SODIUM CHLORIDE PO, Take by mouth daily at 12 noon., Disp: , Rfl:    vitamin B-12 (CYANOCOBALAMIN) 500 MCG tablet, Take 500 mcg by mouth daily., Disp: , Rfl:    zolpidem (AMBIEN CR) 12.5 MG CR tablet, Take 12.5 mg by mouth at bedtime., Disp: , Rfl:   Orders Placed This Encounter  Procedures   C-reactive protein   Sedimentation rate   CBC   LONG TERM MONITOR (3-14 DAYS)   EKG 12-Lead   PCV ECHOCARDIOGRAM COMPLETE    There are no Patient Instructions on file for this visit.   --Continue cardiac medications as reconciled in final medication list. --Return in about 4 weeks (around 02/09/2021) for Follow up, Palpitations, Review test results. Or sooner if needed. --Continue follow-up with your primary care physician regarding the  management of your other chronic comorbid conditions.  Patient's questions and concerns were addressed to her satisfaction. She voices understanding of the instructions provided during this encounter.   This note was created using a voice recognition software as a result there may be grammatical errors inadvertently enclosed that do not reflect the nature of this encounter. Every attempt is made to correct such errors.  Rex Kras, [place], [facility]  Pager: [phone] Office: [phone]
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
## 2021-01-12 ENCOUNTER — Inpatient Hospital Stay: Payer: 59

## 2021-01-12 ENCOUNTER — Encounter: Payer: Self-pay | Admitting: Cardiology

## 2021-01-12 ENCOUNTER — Ambulatory Visit: Payer: 59 | Admitting: Cardiology

## 2021-01-12 VITALS — BP 116/79 | HR 83 | Resp 16 | Ht 62.0 in | Wt 109.0 lb

## 2021-01-12 DIAGNOSIS — R002 Palpitations: Secondary | ICD-10-CM

## 2021-01-12 DIAGNOSIS — A6920 Lyme disease, unspecified: Secondary | ICD-10-CM

## 2021-01-12 LAB — IRON AND TIBC
Iron: 65 ug/dL (ref 41–142)
Saturation Ratios: 16 % — ABNORMAL LOW (ref 21–57)
TIBC: 418 ug/dL (ref 236–444)
UIBC: 353 ug/dL (ref 120–384)

## 2021-01-12 LAB — FERRITIN: Ferritin: 8 ng/mL — ABNORMAL LOW (ref 11–307)

## 2021-01-13 ENCOUNTER — Ambulatory Visit: Payer: 59 | Admitting: Hematology

## 2021-01-13 ENCOUNTER — Telehealth: Payer: Self-pay

## 2021-01-13 NOTE — Telephone Encounter (Signed)
Contacted pt to discuss: Is pt taking oral iron as recommended by Dr. Irene Limbo at last visit? There is evidence of iron deficiency without anemia based on most recent labs from this week.    Pt states she has recently tried multiple types of iron with food and is unable to tolerate any. This has been the issue since she had COVID.  Let pt know I would update Dede Query PA . Pt acknowledged.

## 2021-01-18 ENCOUNTER — Encounter: Payer: Self-pay | Admitting: Hematology

## 2021-01-18 ENCOUNTER — Other Ambulatory Visit: Payer: Self-pay

## 2021-01-18 DIAGNOSIS — D509 Iron deficiency anemia, unspecified: Secondary | ICD-10-CM

## 2021-01-19 ENCOUNTER — Inpatient Hospital Stay: Payer: 59 | Attending: Hematology | Admitting: Hematology

## 2021-01-19 ENCOUNTER — Other Ambulatory Visit: Payer: Self-pay

## 2021-01-19 VITALS — BP 111/72 | HR 99 | Temp 98.6°F | Resp 20 | Wt 111.5 lb

## 2021-01-19 DIAGNOSIS — E538 Deficiency of other specified B group vitamins: Secondary | ICD-10-CM | POA: Diagnosis not present

## 2021-01-19 DIAGNOSIS — D509 Iron deficiency anemia, unspecified: Secondary | ICD-10-CM

## 2021-01-25 ENCOUNTER — Encounter: Payer: Self-pay | Admitting: Hematology

## 2021-01-25 ENCOUNTER — Other Ambulatory Visit: Payer: 59

## 2021-01-25 ENCOUNTER — Ambulatory Visit: Payer: 59 | Admitting: Internal Medicine
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
## 2021-02-02 ENCOUNTER — Ambulatory Visit: Payer: 59

## 2021-02-02 ENCOUNTER — Other Ambulatory Visit: Payer: Self-pay

## 2021-02-02 DIAGNOSIS — A6920 Lyme disease, unspecified: Secondary | ICD-10-CM

## 2021-02-14 ENCOUNTER — Ambulatory Visit: Payer: 59 | Admitting: Cardiology

## 2021-02-17 ENCOUNTER — Telehealth: Payer: Self-pay

## 2021-02-17 NOTE — Telephone Encounter (Signed)
Patient calling for monitor results. She does not have a follow up appointment.

## 2021-02-21 NOTE — Telephone Encounter (Signed)
Please see her chart.

## 2021-02-21 NOTE — Progress Notes (Signed)
Tried calling patient not able to leave VM

## 2021-02-28 NOTE — Progress Notes (Signed)
Called pt no answer, left a vm to return the call.

## 2021-03-01 NOTE — Progress Notes (Signed)
2nd attempt : Called patient, NA, LMAM

## 2021-05-22 ENCOUNTER — Encounter: Payer: Self-pay | Admitting: Hematology

## 2021-06-06 ENCOUNTER — Other Ambulatory Visit: Payer: Self-pay | Admitting: Hematology

## 2021-09-06 ENCOUNTER — Other Ambulatory Visit: Payer: Self-pay | Admitting: Family Medicine

## 2021-09-06 DIAGNOSIS — N6314 Unspecified lump in the right breast, lower inner quadrant: Secondary | ICD-10-CM

## 2021-09-06 DIAGNOSIS — N6323 Unspecified lump in the left breast, lower outer quadrant: Secondary | ICD-10-CM

## 2021-09-06 DIAGNOSIS — N6332 Unspecified lump in axillary tail of the left breast: Secondary | ICD-10-CM

## 2021-09-21 ENCOUNTER — Encounter: Payer: Self-pay | Admitting: Hematology

## 2021-09-28 ENCOUNTER — Ambulatory Visit: Payer: 59 | Attending: Family Medicine

## 2021-09-28 DIAGNOSIS — R293 Abnormal posture: Secondary | ICD-10-CM | POA: Diagnosis present

## 2021-09-28 DIAGNOSIS — R252 Cramp and spasm: Secondary | ICD-10-CM | POA: Insufficient documentation

## 2021-09-28 DIAGNOSIS — M6281 Muscle weakness (generalized): Secondary | ICD-10-CM

## 2021-09-28 DIAGNOSIS — R262 Difficulty in walking, not elsewhere classified: Secondary | ICD-10-CM | POA: Diagnosis not present

## 2021-09-28 NOTE — Therapy (Signed)
?OUTPATIENT PHYSICAL THERAPY CERVICAL EVALUATION ? ? ?Patient Name: Lori Mccarty ?MRN: [id number] ?DOB:March 27, 1991, 31 y.o., female ?Today's Date: 09/29/2021 ? ? PT End of Session - 09/28/21 2224   ? ? Visit Number 1   ? Date for PT Re-Evaluation 11/23/21   ? Authorization Type [employer]   ? PT Start Time 1230   ? PT Stop Time 1315   ? PT Time Calculation (min) 45 min   ? Activity Tolerance Patient tolerated treatment well   ? Behavior During Therapy [facility] for tasks assessed/performed   ? ?  ?  ? ?  ? ? ?Past Medical History:  ?Diagnosis Date  ? Bronchitis   ? Celiac disease   ? Cerebral vasospasm   ? Chronic fatigue   ? Erythema migrans (Lyme disease) 2022  ? Eustachian tube disorder, bilateral   ? Hyperkinetic   ? gall bladder  ? Immune disorder ([place])   ? inflammatory dysfunction  ? Insomnia   ? Iron deficiency   ? Pruritic condition   ? Scoliosis   ? from injury  ? 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 cheerleading accident  ? Vegetarian diet   ? ?Past Surgical History:  ?Procedure Laterality Date  ? [place] EXTRACTION  2012  ? ?Patient Active Problem List  ? Diagnosis Date Noted  ? Intolerance, food 10/29/2019  ? Perennial and seasonal allergic rhinitis 10/29/2019  ? Allergic conjunctivitis 10/29/2019  ? Recurrent urticaria 10/29/2019  ? Iron deficiency anemia 09/22/2019  ? 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 ? ?PCP: Christa See, FNP ? ?REFERRING PROVIDER: Faustino Congress, NP ? ?REFERRING DIAG: M54.2 (ICD-10-CM) - Cervicalgia  ? ?THERAPY DIAG:  ?Abnormal posture - Plan: PT plan of care cert/re-cert ? ?Cramp and spasm - Plan: PT plan of care cert/re-cert ? ?Difficulty in walking, not elsewhere classified - Plan: PT plan of care cert/re-cert ? ?Muscle weakness (generalized) - Plan: PT plan of care cert/re-cert ? ?ONSET DATE: 09/18/21 ? ?SUBJECTIVE:                                                                                                                                                                                                         ? ?SUBJECTIVE STATEMENT: ?Patient reports long history of multiple medical issues including TBI at a young age.  She states she had a rather lengthy recovery period and lost her memory.  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  She explains that she has nerve disorder and multiple other medical issues.  She previously had  headaches that kept her out of school for as many as 60 days as a young girl after the TBI.  These have resolved and she had been doing well until she was in a sauna and fell back and hit the back of her neck.  She has been having neck pain since this time.  She states she has had chiropractic care in which she suffered a set back when the chiropractor adjusted her neck leaving her unable to use her arms for most of the day.  She also had a treatment with physical therapy where they did some sub occipital release and resulted her being bedridden for 2 days.  She is hoping to resolve her neck pain.   ? ?PERTINENT HISTORY:  ?See above ? ?PAIN:  ?Are you having pain? Yes: NPRS scale: 7/10 ?Pain location: neck ?Pain description: aching ?Aggravating factors: work ?Relieving factors: meds ? ?PRECAUTIONS: None ? ?WEIGHT BEARING RESTRICTIONS No ? ?FALLS:  ?Has patient fallen in last 6 months? No ? ?LIVING ENVIRONMENT: ?Lives with: lives alone ?Lives in: House/apartment ? ? ?OCCUPATION: Veterinarian ? ?PLOF: Independent ? ?PATIENT GOALS She is hoping to resolve her neck pain. ? ?OBJECTIVE:  ? ?DIAGNOSTIC FINDINGS:  ?none ? ?PATIENT SURVEYS:  ?FOTO do next visit ? ? ?COGNITION: ?Overall cognitive status: Within functional limits for tasks assessed ? ? ?SENSATION: ?WFL ?C/o non specific radicular symptoms throughout bil UE's ? ?POSTURE:  ?Rounded shoulders fwd head ? ?PALPATION: ?Tender bilateral upper traps and parascapular areas.   ? ?CERVICAL ROM:  ? ?Active ROM A/PROM (deg) ?09/29/2021  ?Flexion WNL with pain  ?Extension WNL with  pain  ?Right lateral flexion WNL with pain  ?Left lateral flexion WNL with pain  ?Right rotation WNL with pain  ?Left rotation WNL with pain  ? (Blank rows = not tested) ? ?UE ROM: ? ?All WNL ? ?UE MMT: ? ?Generally 5/5 throughout with exception of bilateral shoulder ER's ? ?CERVICAL SPECIAL TESTS:  ?Spurling's test: Negative and Distraction test: Negative ? ? ? ?PATIENT SURVEYS:  ?FOTO Do next visit ? ?TODAY'S TREATMENT:  ?Initial eval completed and initiated HEP ? ? ?PATIENT EDUCATION:  ?Education details: Initiated HEP ?Person educated: Patient ?Education method: Explanation, Demonstration, Verbal cues, and Handouts ?Education comprehension: verbalized understanding, returned demonstration, and verbal cues required ? ? ?HOME EXERCISE PROGRAM: ?Initiated HEP ? ?ASSESSMENT: ? ?CLINICAL IMPRESSION: ?Patient is a 31 y.o. female who was seen today for physical therapy evaluation and treatment for neck pain.  She presents with full cervical ROM but pain with all directions.  UE ROM WNL bilaterally and strength 4+ to 5/5 throughout.  This patient has a quite complicated history of not only her neck issues but multiple other medical issues.  She may respond to postural and shoulder strengthening along with STM.  She declines dry needling.  We will also attempt to address pain control.  However, patient has long history of chronic pain issues and declines pain management.  Her mother is an OT and her father is a PT.  Both went to training for craniosacral specialty to try to help her without success.  We discussed any unusual trauma or emotional stress in her life that may be contributing to her chronic pain issues.  She does mention several factors that do seem to cause her stress including her job, relationship status and her past medical history.  She may benefit from manual techniques and postural strengthening but prognosis is poor based on her history of response to PT.   ? ? ?  OBJECTIVE IMPAIRMENTS decreased  mobility, decreased strength, increased fascial restrictions, increased muscle spasms, impaired flexibility, impaired sensation, impaired UE functional use, postural dysfunction, and pain.  ? ?ACTIVITY LIMITATIONS cleaning, community activity, driving, meal prep, occupation, laundry, yard work, and shopping.  ? ?PERSONAL FACTORS Age, Behavior pattern, Fitness, Past/current experiences, Profession, Time since onset of injury/illness/exacerbation, and 3+ comorbidities: neuro disorder, hx of TBI, depression  are also affecting patient's functional outcome.  ? ? ?REHAB POTENTIAL: Poor See subjective ? ?CLINICAL DECISION MAKING: Unstable/unpredictable ? ?EVALUATION COMPLEXITY: High ? ? ?GOALS: ?Goals reviewed with patient? Yes ? ?SHORT TERM GOALS: Target date: 10/27/2021  (Remove Blue Hyperlink) ? ?Patient will be independent with initial HEP  ?Baseline: na ?Goal status: INITIAL ? ?2.  Pain report to be no greater than 4/10  ?Baseline: 7 ?Goal status: INITIAL ? ? ?LONG TERM GOALS: Target date: 11/24/2021 (Remove Blue Hyperlink) ? ?Patient to be independent with advanced HEP  ?Baseline: na ?Goal status: INITIAL ? ?2.  Patient to report pain no greater than 2/10  ?Baseline: 7 ?Goal status: INITIAL ? ?3.  Patient to be able to demonstrated full c spine ROM without pain ?Baseline: na ?Goal status: INITIAL ? ?4.  FOTO to improve by 5 points ?Baseline: na ?Goal status INITIAL ? ? ? ?PLAN: ?PT FREQUENCY: 1-2x/week ? ?PT DURATION: 8 weeks ? ?PLANNED INTERVENTIONS: Therapeutic exercises, Therapeutic activity, Neuromuscular re-education, Balance training, Gait training, Patient/Family education, Joint mobilization, Stair training, Vestibular training, Dry Needling, Electrical stimulation, Spinal mobilization, Cryotherapy, Moist heat, Taping, Traction, Ultrasound, Ionotophoresis 78m/ml Dexamethasone, and Manual therapy ? ?PLAN FOR NEXT SESSION: Review HEP, manual techniques ? ? ?JAnderson MaltaB. Stephene Alegria, PT ?09/29/21 12:35 AM  ? ? ? ?  ?

## 2021-10-04 ENCOUNTER — Emergency Department (HOSPITAL_BASED_OUTPATIENT_CLINIC_OR_DEPARTMENT_OTHER)
Admission: EM | Admit: 2021-10-04 | Discharge: 2021-10-04 | Disposition: A | Discharge status: Home / Self Care | Payer: 59 | Attending: Emergency Medicine | Admitting: Emergency Medicine

## 2021-10-04 ENCOUNTER — Encounter (HOSPITAL_BASED_OUTPATIENT_CLINIC_OR_DEPARTMENT_OTHER): Payer: Self-pay

## 2021-10-04 ENCOUNTER — Other Ambulatory Visit (HOSPITAL_BASED_OUTPATIENT_CLINIC_OR_DEPARTMENT_OTHER): Payer: Self-pay

## 2021-10-04 ENCOUNTER — Encounter: Payer: Self-pay | Admitting: Hematology

## 2021-10-04 ENCOUNTER — Other Ambulatory Visit: Payer: Self-pay

## 2021-10-04 DIAGNOSIS — R110 Nausea: Secondary | ICD-10-CM | POA: Diagnosis not present

## 2021-10-04 DIAGNOSIS — R1084 Generalized abdominal pain: Secondary | ICD-10-CM | POA: Diagnosis present

## 2021-10-04 DIAGNOSIS — R197 Diarrhea, unspecified: Secondary | ICD-10-CM | POA: Diagnosis not present

## 2021-10-04 DIAGNOSIS — K921 Melena: Secondary | ICD-10-CM

## 2021-10-04 DIAGNOSIS — G8929 Other chronic pain: Secondary | ICD-10-CM | POA: Insufficient documentation

## 2021-10-04 LAB — COMPREHENSIVE METABOLIC PANEL
ALT: 11 U/L (ref 0–44)
AST: 15 U/L (ref 15–41)
Albumin: 4.7 g/dL (ref 3.5–5.0)
Alkaline Phosphatase: 55 U/L (ref 38–126)
Anion gap: 11 (ref 5–15)
BUN: 8 mg/dL (ref 6–20)
CO2: 22 mmol/L (ref 22–32)
Calcium: 9.2 mg/dL (ref 8.9–10.3)
Chloride: 104 mmol/L (ref 98–111)
Creatinine, Ser: 0.68 mg/dL (ref 0.44–1.00)
GFR, Estimated: >60 mL/min (ref >60)
Glucose, Bld: 86 mg/dL (ref 70–99)
Potassium: 4.2 mmol/L (ref 3.5–5.1)
Sodium: 137 mmol/L (ref 135–145)
Total Bilirubin: 0.5 mg/dL (ref 0.3–1.2)
Total Protein: 7.3 g/dL (ref 6.5–8.1)

## 2021-10-04 LAB — URINALYSIS, ROUTINE W REFLEX MICROSCOPIC
Bilirubin Urine: NEGATIVE
Glucose, UA: NEGATIVE mg/dL
Ketones, ur: NEGATIVE mg/dL
Leukocytes,Ua: NEGATIVE
Nitrite: NEGATIVE
Protein, ur: NEGATIVE mg/dL
Specific Gravity, Urine: <1.005 — ABNORMAL LOW (ref 1.005–1.030)
pH: 5.5 (ref 5.0–8.0)

## 2021-10-04 LAB — OCCULT BLOOD X 1 CARD TO LAB, STOOL: Fecal Occult Bld: POSITIVE — AB

## 2021-10-04 LAB — CBC
HCT: 42 % (ref 36.0–46.0)
Hemoglobin: 13.8 g/dL (ref 12.0–15.0)
MCH: 29.9 pg (ref 26.0–34.0)
MCHC: 32.9 g/dL (ref 30.0–36.0)
MCV: 91.1 fL (ref 80.0–100.0)
Platelets: 293 10*3/uL (ref 150–400)
RBC: 4.61 MIL/uL (ref 3.87–5.11)
RDW: 13.2 % (ref 11.5–15.5)
WBC: 8.8 10*3/uL (ref 4.0–10.5)
nRBC: 0 % (ref 0.0–0.2)

## 2021-10-04 LAB — LIPASE, BLOOD: Lipase: 14 U/L (ref 11–51)

## 2021-10-04 LAB — PREGNANCY, URINE: Preg Test, Ur: NEGATIVE

## 2021-10-04 MED ORDER — SODIUM CHLORIDE 0.9 % IV BOLUS
1000.0000 mL | Freq: Once | INTRAVENOUS | Status: AC
Start: 2021-10-04 — End: 2021-10-04
  Administered 2021-10-04: 1000 mL via INTRAVENOUS

## 2021-10-04 MED ORDER — AZITHROMYCIN 500 MG PO TABS
500.0000 mg | ORAL_TABLET | Freq: Every day | ORAL | 0 refills | Status: AC
  Filled 2021-10-04: qty 6, 5d supply, fill #0

## 2021-10-04 MED ORDER — KETOROLAC TROMETHAMINE 15 MG/ML IJ SOLN
15.0000 mg | Freq: Once | INTRAMUSCULAR | Status: AC
  Administered 2021-10-04: 15 mg via INTRAVENOUS
  Filled 2021-10-04: qty 1

## 2021-10-04 MED ORDER — ONDANSETRON HCL 4 MG/2ML IJ SOLN
4.0000 mg | Freq: Once | INTRAMUSCULAR | Status: AC
  Administered 2021-10-04: 4 mg via INTRAVENOUS
  Filled 2021-10-04: qty 2

## 2021-10-04 MED ORDER — PROCHLORPERAZINE EDISYLATE 10 MG/2ML IJ SOLN
10.0000 mg | Freq: Once | INTRAMUSCULAR | Status: DC
  Filled 2021-10-04: qty 2

## 2021-10-04 NOTE — ED Triage Notes (Signed)
She reports abd. Pain and some bloody diarrhea. She has a complex G.I. hx, including IV iron infusion and adrenal insufficiency. She is ambulatory and ambulates bent over, as if in pain. ?

## 2021-10-04 NOTE — ED Provider Notes (Signed)
[facility] Provider Note   CSN: [id number] Arrival date & time: 10/04/21  1235     History  Chief Complaint  Patient presents with   Abdominal Pain   hematachezia    Lori Mccarty is a 31 y.o. female.  Felt abdominal pain x 6 am felt ruptured  31 y.o female with a PMH of Celiac, iron deficiency, presents to the ED with a chief complaint of sudden onset of abdominal pain which began around 6 AM this morning, she describes this as recently, she does have a prior history of chronic abdominal pain and is followed by gastroenterology Dr. Tamera Punt in [place].  She does report having some nausea, diarrhea episodes after this occurred seen approximately 3 to 5cc of blood on the toilet bowl.  She does take Bentyl for these episodes, however she has not been able to keep things down.  She feels nauseated despite taking 4 mg of Zofran at 7 AM this morning.  He recently began having steroid therapy for ongoing renal insufficiency, was prescribed to have hydrocortisone 15 mg, however began the first dose last night around 5 mg.  Was supposed to have a colonoscopy last month, however was unable to do this after being sick.  She has had an EGD, MRCP, endoscopy in the last 12 months.  She is concerned due to the amount of blood in her stool.  Her last menstrual cycle began 2 days ago.No fever, no chest pain, no shortness of breath.   The history is provided by the patient and medical records.  Abdominal Pain Pain location:  Generalized Pain quality: cramping   Pain radiates to:  Does not radiate Duration:  7 hours Timing:  Constant Progression:  Worsening Chronicity:  New Associated symptoms: nausea   Associated symptoms: no chest pain, no chills, no fever, no shortness of breath, no sore throat and no vomiting       Home Medications Prior to Admission medications   Medication Sig Start Date End Date Taking? Authorizing Provider  azithromycin (ZITHROMAX) 500 MG  tablet Take 1 tablet (500 mg total) by mouth daily for 5 days. Take first 2 tablets together, then 1 every day until finished. 10/04/21 10/09/21 Yes Esequiel Kleinfelter, PA-C  B Complex-C (B-COMPLEX WITH VITAMIN C) tablet Take 1 tablet by mouth 3 (three) times a week.    [provider]  Cholecalciferol (VITAMIN D3) 125 MCG (5000 UT) TABS Take 5,000 Units by mouth 3 (three) times a week.    [provider]  SODIUM CHLORIDE PO Take by mouth daily at 12 noon.    [provider]  vitamin B-12 (CYANOCOBALAMIN) 500 MCG tablet Take 500 mcg by mouth daily.    [provider]  zolpidem (AMBIEN CR) 12.5 MG CR tablet Take 12.5 mg by mouth at bedtime. 11/28/20   [provider]      Allergies    Tetracycline hcl, Sulfa antibiotics, Dulera [mometasone furo-formoterol fum], Gluten meal, Mestinon [pyridostigmine], and Penicillins    Review of Systems   Review of Systems  Constitutional:  Negative for chills and fever.  HENT:  Negative for sore throat.   Respiratory:  Negative for shortness of breath.   Cardiovascular:  Negative for chest pain.  Gastrointestinal:  Positive for abdominal pain, blood in stool and nausea. Negative for vomiting.  Genitourinary:  Negative for flank pain.  Neurological:  Positive for weakness. Negative for light-headedness and headaches.  All other systems reviewed and are negative.  Physical Exam Updated  Vital Signs BP 120/79   Pulse 78   Temp 98.3 F (36.8 C)   Resp 16   LMP 10/02/2021   SpO2 100%  Physical Exam Vitals and nursing note reviewed.  Constitutional:      Appearance: She is well-developed. She is not ill-appearing or toxic-appearing.  HENT:     Head: Normocephalic and atraumatic.  Cardiovascular:     Rate and Rhythm: Normal rate.  Pulmonary:     Effort: Pulmonary effort is normal.     Breath sounds: No wheezing.  Abdominal:     General: Abdomen is flat. Bowel sounds are normal.     Palpations: Abdomen is soft.      Tenderness: There is generalized abdominal tenderness.  Skin:    General: Skin is warm and dry.  Neurological:     Mental Status: She is alert and oriented to person, place, and time.    ED Results / Procedures / Treatments   Labs (all labs ordered are listed, but only abnormal results are displayed) Labs Reviewed  URINALYSIS, ROUTINE W REFLEX MICROSCOPIC - Abnormal; Notable for the following components:      Result Value   Color, Urine COLORLESS (*)    Specific Gravity, Urine <1.005 (*)    Hgb urine dipstick MODERATE (*)    All other components within normal limits  OCCULT BLOOD X 1 CARD TO LAB, STOOL - Abnormal; Notable for the following components:   Fecal Occult Bld POSITIVE (*)    All other components within normal limits  GASTROINTESTINAL PANEL BY PCR, STOOL (REPLACES STOOL CULTURE)  LIPASE, BLOOD  COMPREHENSIVE METABOLIC PANEL  CBC  PREGNANCY, URINE    EKG None  Radiology No results found.  Procedures Procedures    Medications Ordered in ED Medications  sodium chloride 0.9 % bolus 1,000 mL (1,000 mLs Intravenous New Bag/Given 10/04/21 1412)  ketorolac (TORADOL) 15 MG/ML injection 15 mg (15 mg Intravenous Given 10/04/21 1428)  ondansetron (ZOFRAN) injection 4 mg (4 mg Intravenous Given 10/04/21 1427)    ED Course/ Medical Decision Making/ A&P Clinical Course as of 10/04/21 1545  Wed Oct 04, 2021  1446 Fecal Occult Blood, POC(!): POSITIVE [JS]    Clinical Course User Index [JS] Janeece Fitting, PA-C                           Medical Decision Making Here with 6 hours of abdominal pain, does have Bentyl for abdominal spasms at home.  Vitals are within normal limits, requesting fluid hydration.  Given this to patient.  Labs are within normal limits.  Differential diagnosis included but not limited to acute on chronic abdominal pain, gastroenteritis, infectious diarrhea in the setting of blood versus menstrual cycle cramping.  Labs are within normal limits, GI  panel will take several days to get back.  Given medication for pain control with some relief in symptoms.  Some concern for infectious diarrhea at this time in the setting of blood in her stool versus inflammatory cause.  Patient is hemodynamically stable.  I do feel that outpatient colonoscopy is warranted at this time.  Go home on a short course of Zithromax which she has tolerated adequately in the last with no   Problems Addressed: Blood in stool: acute illness or injury    Details: Stable hemoglobin, on IV iron infusion. No systemic signs. Chronic abdominal pain:    Details: Ongoing, prescribed Bentyl but unable to take it due to nausea.  Amount  and/or Complexity of Data Reviewed Labs: ordered. Decision-making details documented in ED Course.    Details: Labs are within normal limits, hemoglobin is unremarkable despite her blood in stool and menstrual cycle. Radiology:     Details: CT abdomen deferred on today's visit due to improvement after pain control.  Risk Prescription drug management.   Patient presents to the ED with 6 hours of abdominal pain, has a previous complex GI history according to her with IV iron infusion, renal insufficiency, currently on steroids began taking this yesterday.  Describes the pain as severe, feeling nauseated, with the desire to vomit, did have diarrhea where she noted blood in her stool.  Reports prior infection of Shigella in the past, along with Campylobacter.  Patient currently employed as a Animal nutritionist.  Differential diagnoses included as acute on chronic abdominal pain, gastroenteritis, infectious diarrhea in the setting of blood.   On arrival patient's blood pressure is within normal limits, no signs of fever, she is satting at 100% on room air.  She is holding on her abdomen, reporting significant pain that is generalized throughout the entire abdomen.  Last menstrual cycle she is currently on her second day.  No CVA tenderness, no urinary symptoms  on today's visit.  Followed closely by GI in [place].  Labs on today's visit with a CMP with no electrolyte derangement, creatinine levels within normal limits.  LFTs unremarkable.  No signs of dehydration according to labs, however she does recall due to this autonomic disorder she needs to be fluid resuscitated, given 1 L bolus.  CBC with no leukocytosis hemoglobin is 13.8, does report it was 14 a couple days ago but she is currently on her menstrual cycle.  Lipase level is normal.  Occult card positive for blood in her stool.  UA with some moderate hemoglobin but no signs of infection.   Given Toradol, Zofran for symptomatic control.  Hemoccult is positive on today's visit.  GI panel is currently pending, pregnancy test is negative.  The systems were discussed with patient.  3:39 PM patient reassessed by me, does report improvement in symptoms after medications.  I did discuss with her GI panel will take several days to return.  We did agree on prophylactically placing her on antibiotics, she has tolerated Zithromax in the past, will go home on a 5-day course for factious diarrhea in the setting of blood.  We did discuss risk and benefits of CT imaging at this time, I did discuss with her the importance of obtaining colonoscopy, she is concerned for UC, I did discuss with her that I am unable to diagnose based on today's visit but she will need further outpatient work-up.  She is agreeable with this at this time.   Portions of this note were generated with [employer]. Dictation errors may occur despite best attempts at proofreading.   Final Clinical Impression(s) / ED Diagnoses Final diagnoses:  Blood in stool  Chronic abdominal pain    Rx / DC Orders ED Discharge Orders          Ordered    azithromycin (ZITHROMAX) 500 MG tablet  Daily        10/04/21 1538              Janeece Fitting, PA-C 10/04/21 1545    Elnora Morrison, MD 10/07/21 [phone]

## 2021-10-04 NOTE — Discharge Instructions (Addendum)
Your laboratory results were within normal limits today. Your GI panel results will return in the next couple of days.  ? ?We will prescribe antibiotics prophylactically on today's visit due to your ongoing bloody diarrhea.  Please take 1 tablet daily for the next 5 days.  Please be aware if your GI panel results resistant to the antibiotic you will be: To have this therapy change. ? ?If you experience any fever, worsening symptoms, worsening pain you will need to return to the emergency department. ? ?

## 2021-10-05 LAB — GASTROINTESTINAL PANEL BY PCR, STOOL (REPLACES STOOL CULTURE)

## 2022-01-16 IMAGING — US US ABDOMEN LIMITED
1 series · 14 of 25 positions shown · non-contrast
Comparison: 02/20/2019

CLINICAL DATA: Right upper quadrant abdominal pain for 1 day

EXAM:
ULTRASOUND ABDOMEN LIMITED RIGHT UPPER QUADRANT

[Series 1: us abdomen limited · 0.09mm/px · 14 of 75 slices shown]
[im 1/75]
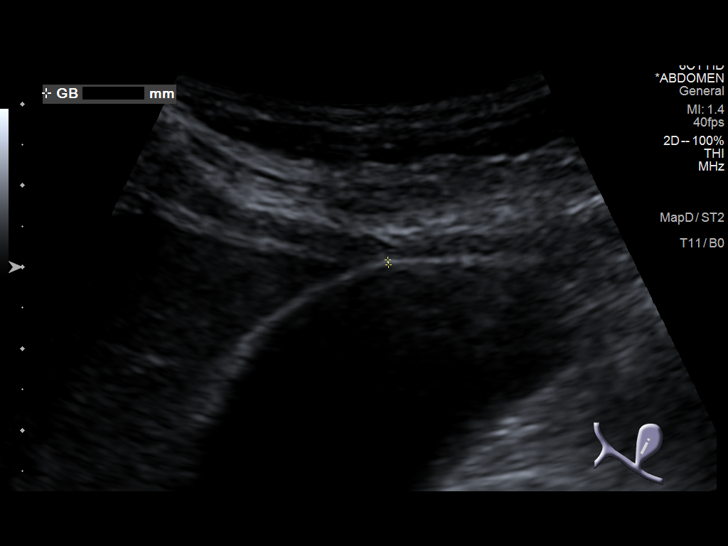
[im 7/75]
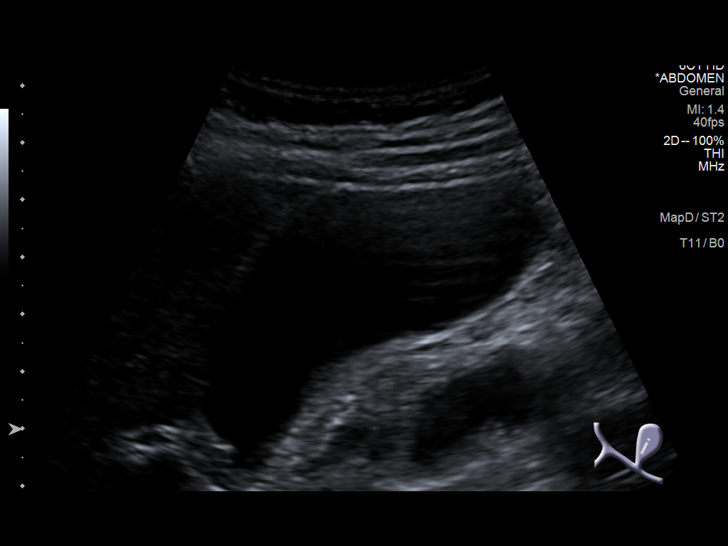
[im 13/75]
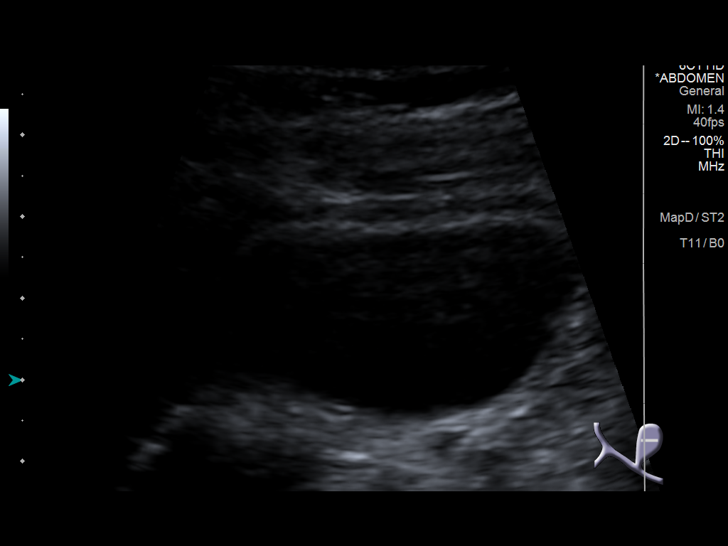
[im 19/75]
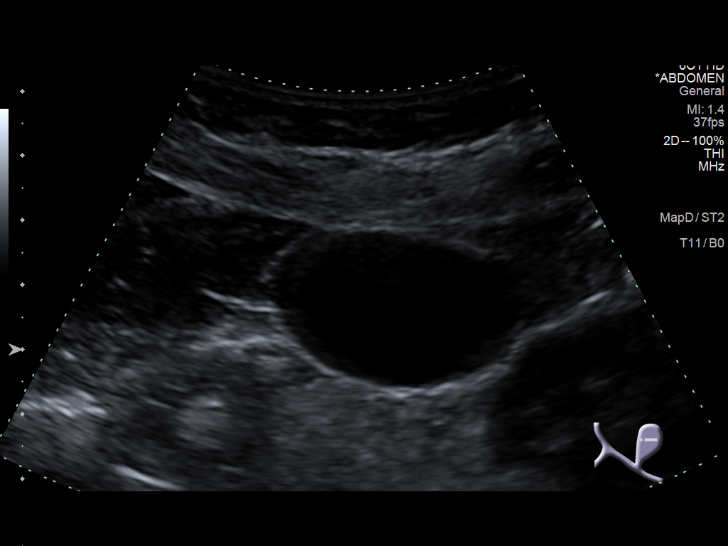
[im 25/75]
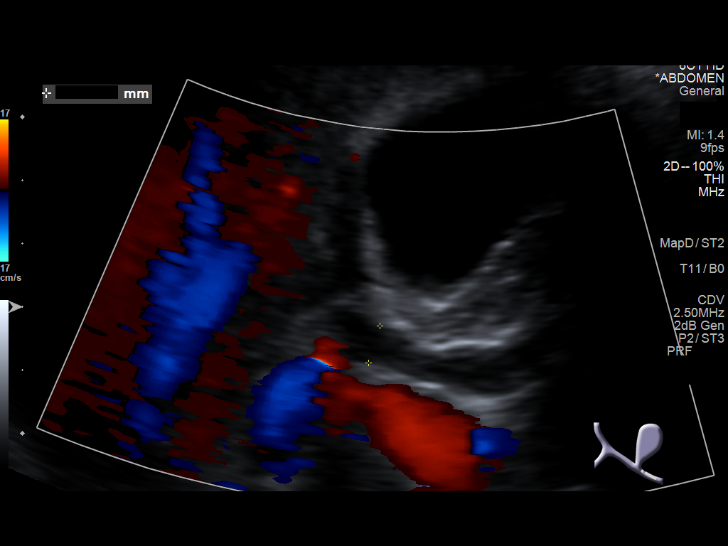
[im 28/75]
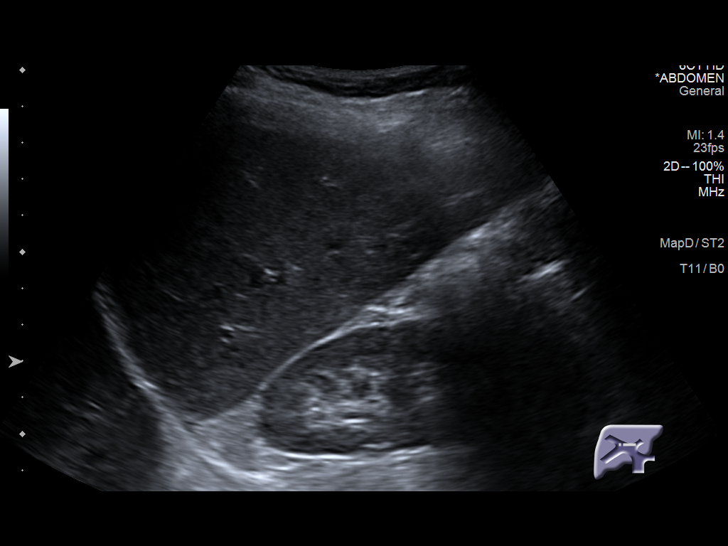
[im 34/75]
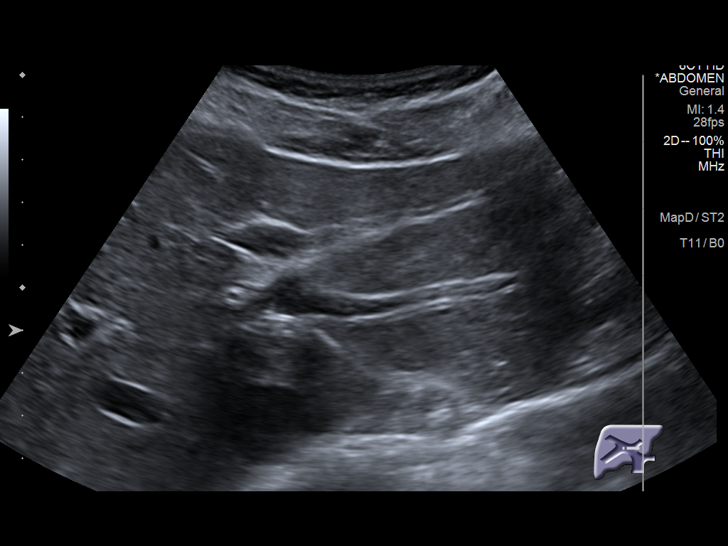
[im 41/75]
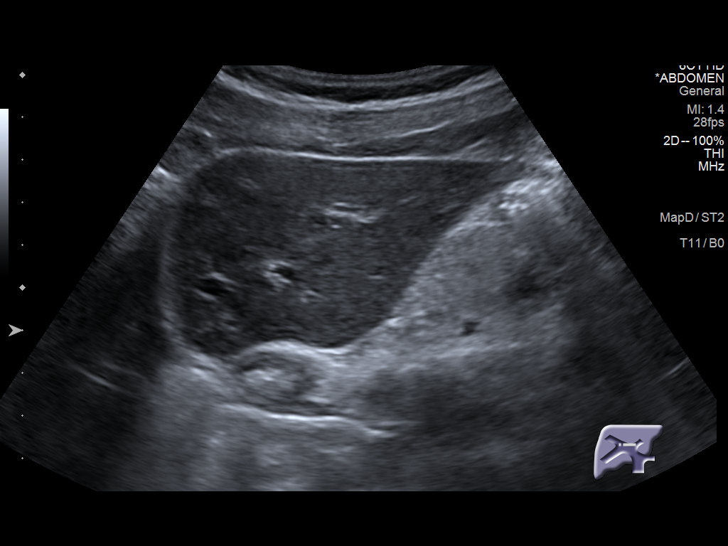
[im 47/75]
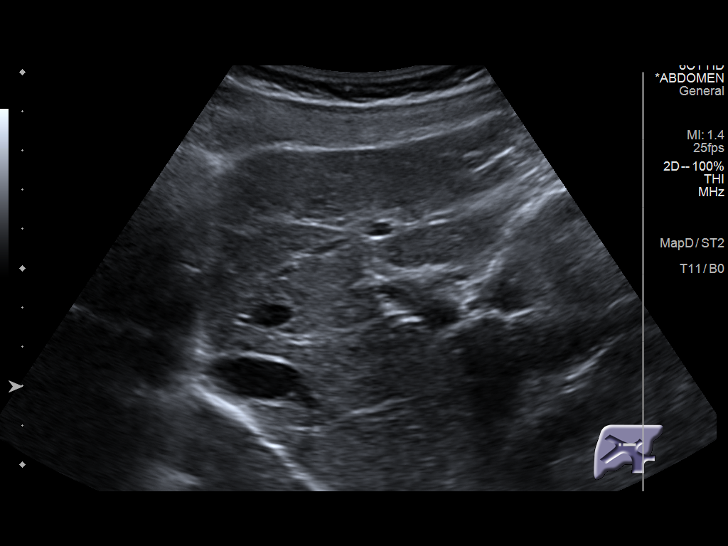
[im 50/75]
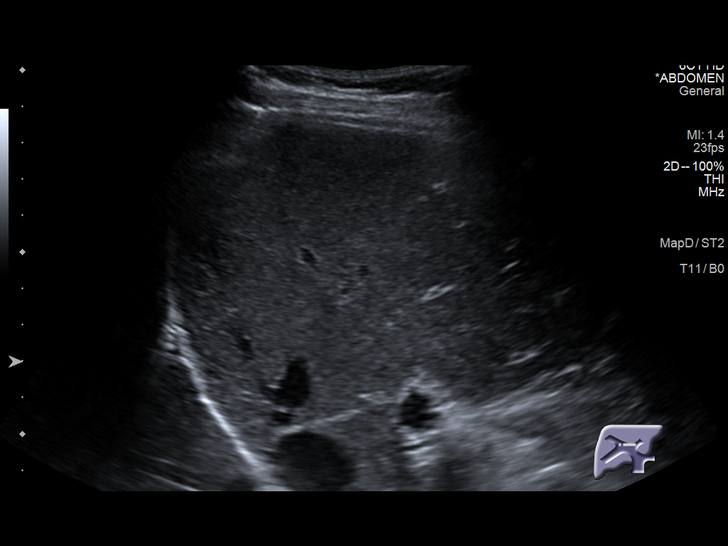
[im 56/75]
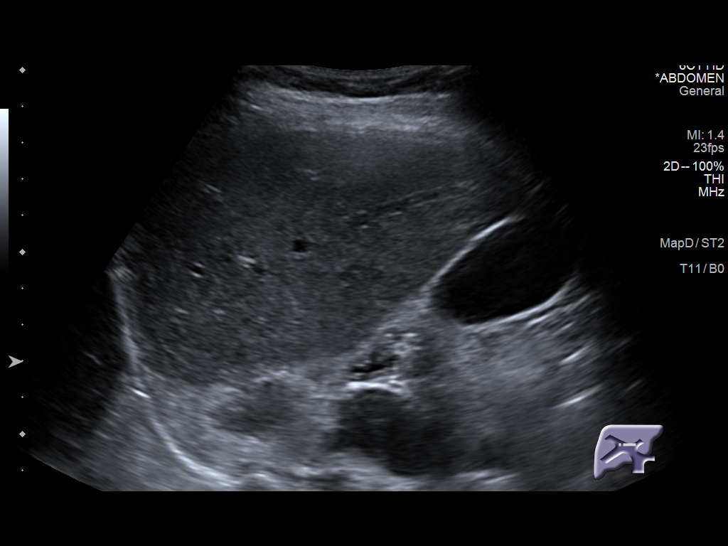
[im 62/75]
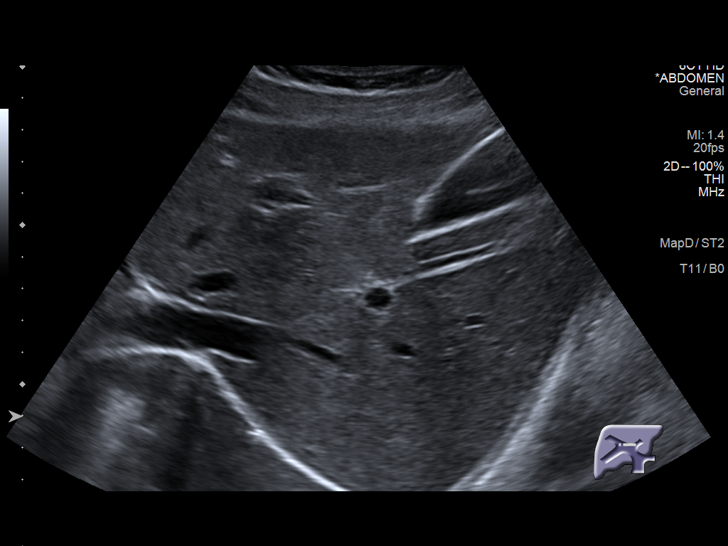
[im 68/75]
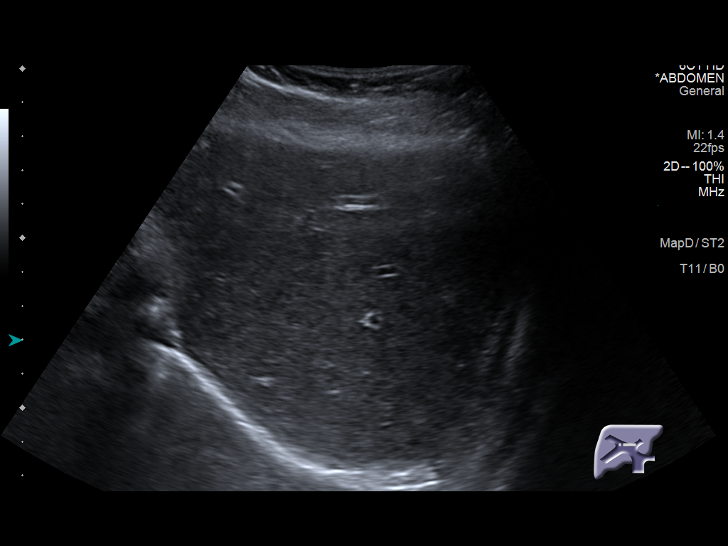
[im 75/75]
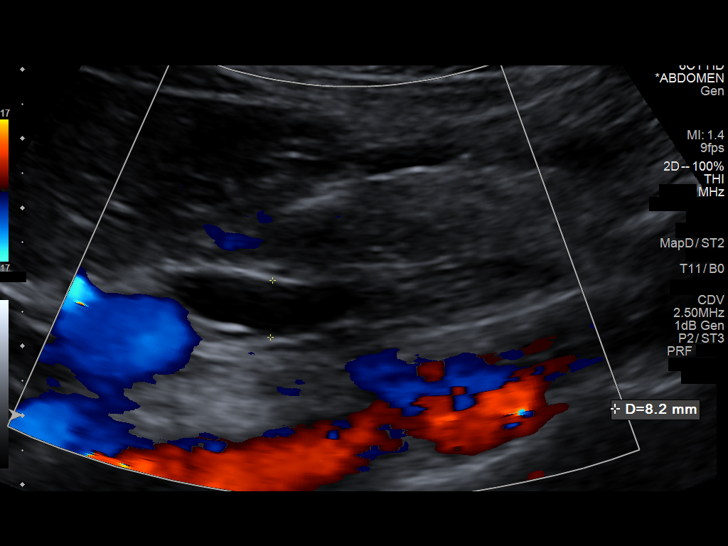

[14 of 25 positions shown; findings below may reference images not displayed]

FINDINGS: Gallbladder:

Mildly dilated. No gallstones or wall thickening visualized. No
sonographic Murphy sign noted by sonographer.

Common bile duct:

Diameter: Measures 2 mm proximally and up to 8 mm with the level
pancreatic head. No intraductal abnormality within the visualized
portion of the CBD.

Liver:

No focal lesion identified. Within normal limits in parenchymal
echogenicity. Portal vein is patent on color Doppler imaging with
normal direction of blood flow towards the liver.

Other: None.
IMPRESSION: 1. Mildly dilated common bile duct measuring up to 8 mm at the level
of the pancreatic head. No evidence of choledocholithiasis or other
obstructive etiology by ultrasound.
2. The gallbladder is mildly dilated. No gallstones or wall
thickening to suggest cholecystitis.
3. Unremarkable appearance of the liver.

## 2022-03-01 ENCOUNTER — Other Ambulatory Visit: Payer: Self-pay | Admitting: Family Medicine

## 2022-03-01 DIAGNOSIS — K838 Other specified diseases of biliary tract: Secondary | ICD-10-CM

## 2022-05-09 ENCOUNTER — Ambulatory Visit (HOSPITAL_BASED_OUTPATIENT_CLINIC_OR_DEPARTMENT_OTHER)
Admission: RE | Admit: 2022-05-09 | Discharge: 2022-05-09 | Disposition: A | Discharge status: Home / Self Care | Payer: 59 | Source: Ambulatory Visit | Attending: Family Medicine | Admitting: Family Medicine

## 2022-05-09 ENCOUNTER — Other Ambulatory Visit (HOSPITAL_BASED_OUTPATIENT_CLINIC_OR_DEPARTMENT_OTHER): Payer: Self-pay | Admitting: Family Medicine

## 2022-05-09 ENCOUNTER — Encounter: Payer: Self-pay | Admitting: Hematology

## 2022-05-09 DIAGNOSIS — R1011 Right upper quadrant pain: Secondary | ICD-10-CM

## 2022-05-10 ENCOUNTER — Other Ambulatory Visit: Payer: 59

## 2022-05-24 ENCOUNTER — Other Ambulatory Visit: Payer: 59

## 2022-07-20 ENCOUNTER — Other Ambulatory Visit: Payer: Self-pay | Admitting: Family Medicine

## 2022-07-20 DIAGNOSIS — M7989 Other specified soft tissue disorders: Secondary | ICD-10-CM

## 2022-08-13 IMAGING — US US ABDOMEN COMPLETE
1 series · 14 of 25 positions shown · non-contrast
Comparison: Ultrasound 05/12/2020, 02/20/2019

CLINICAL DATA: Generalized abdominal pain

EXAM:
ABDOMEN ULTRASOUND COMPLETE

[Series 1: us abdomen complete · 0.22mm/px · 14 of 92 slices shown]
[im 1/92]
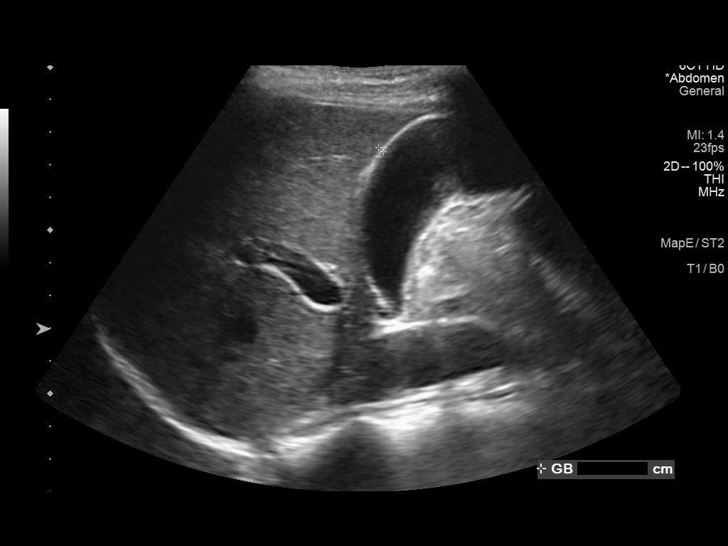
[im 8/92]
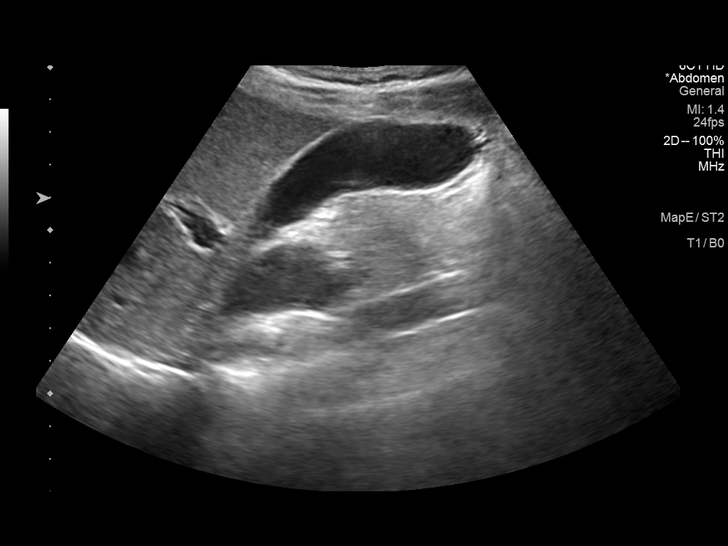
[im 16/92]
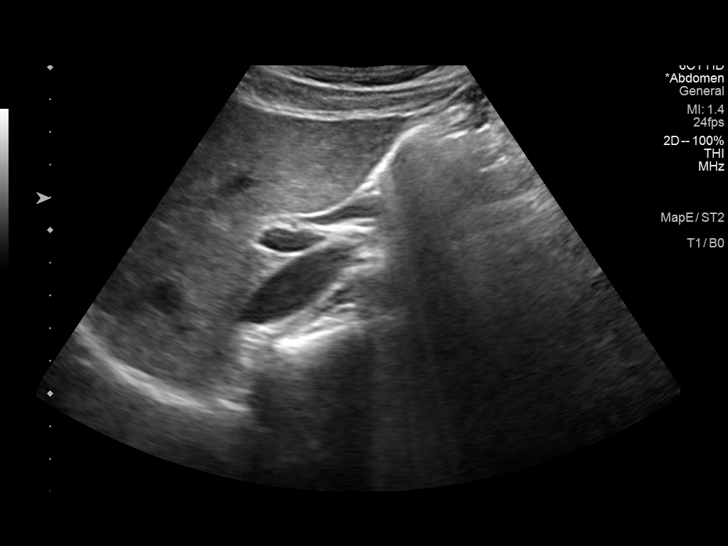
[im 23/92]
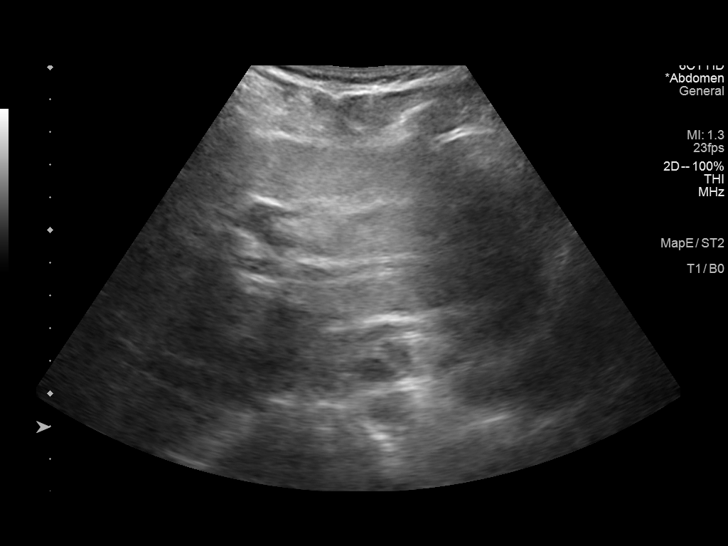
[im 31/92]
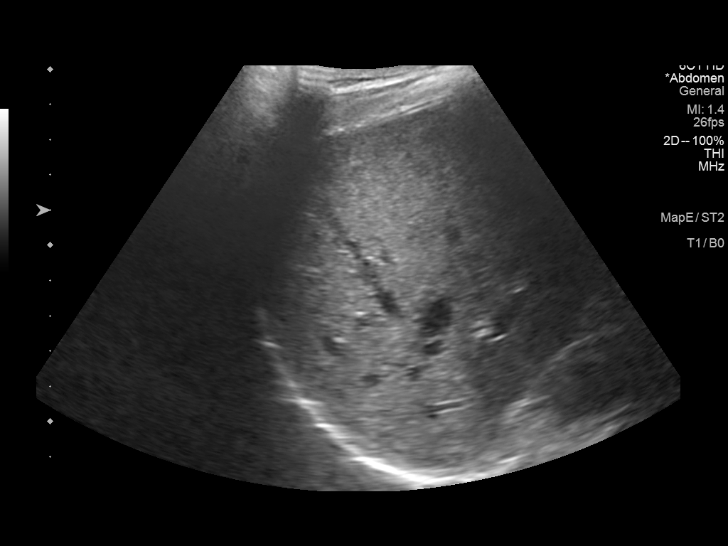
[im 35/92]
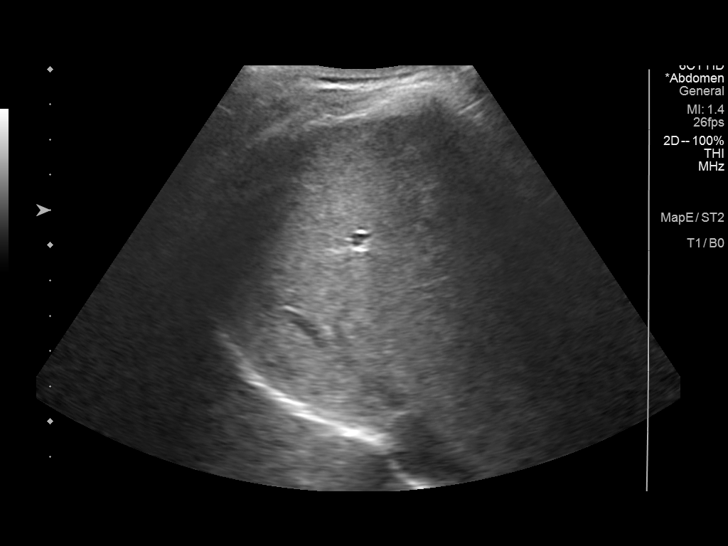
[im 42/92]
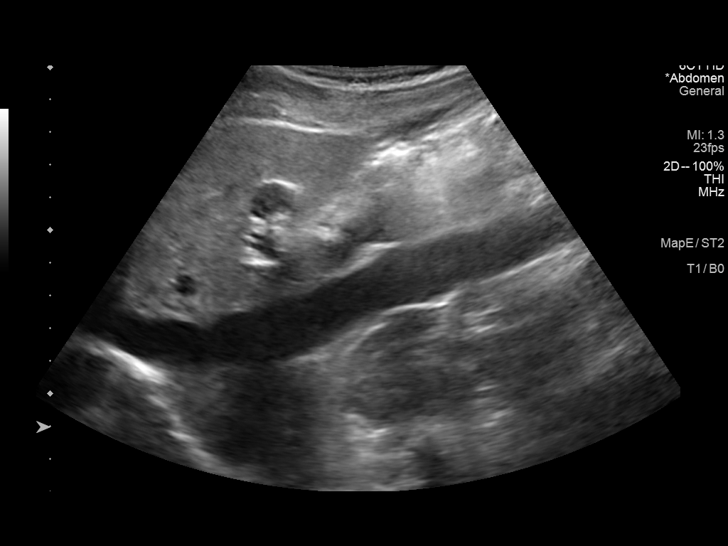
[im 50/92]
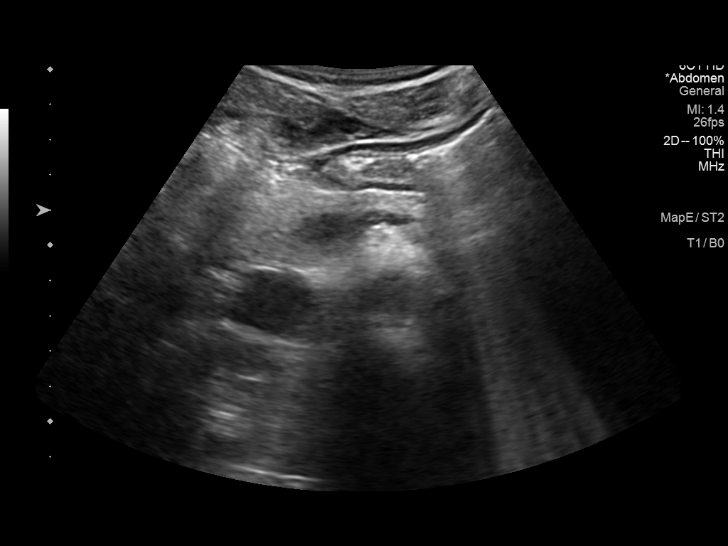
[im 57/92]
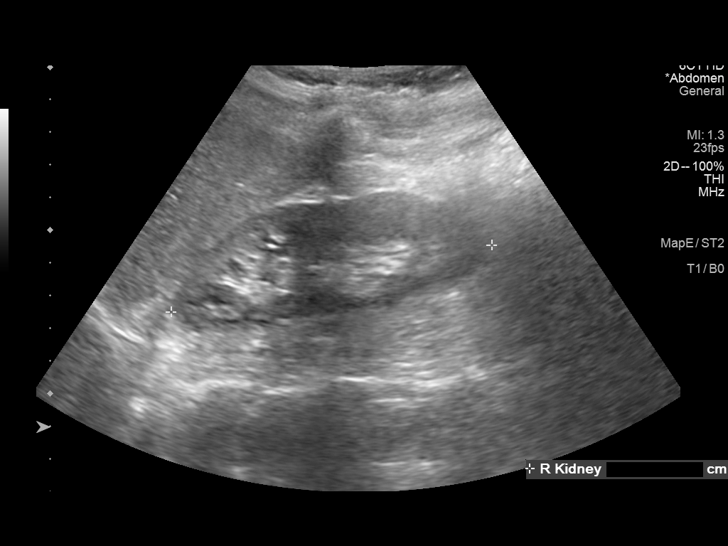
[im 61/92]
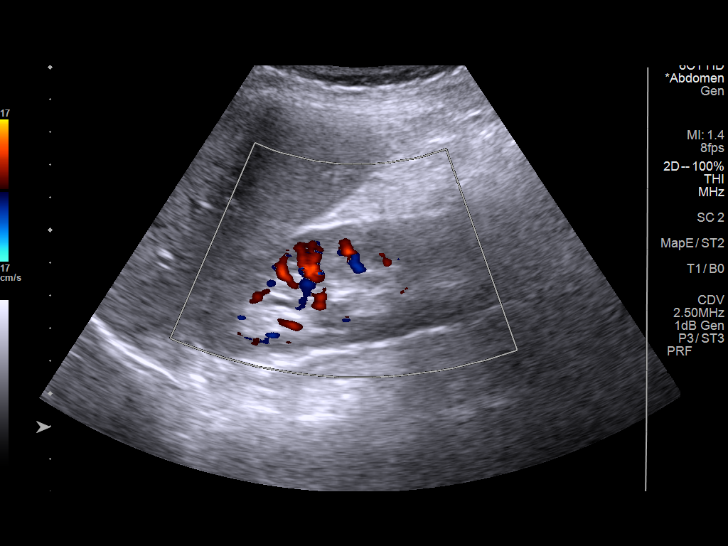
[im 69/92]
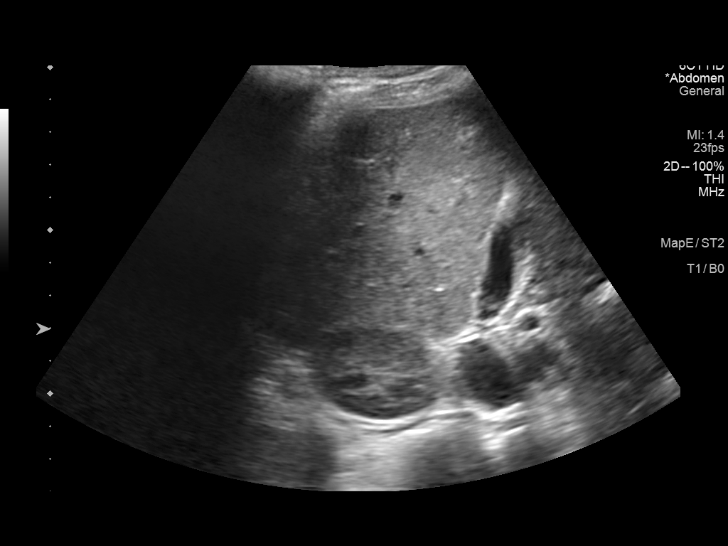
[im 76/92]
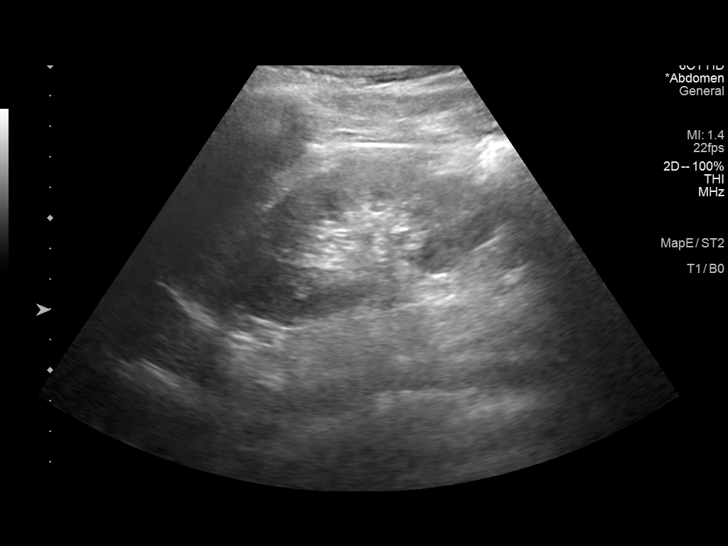
[im 84/92]
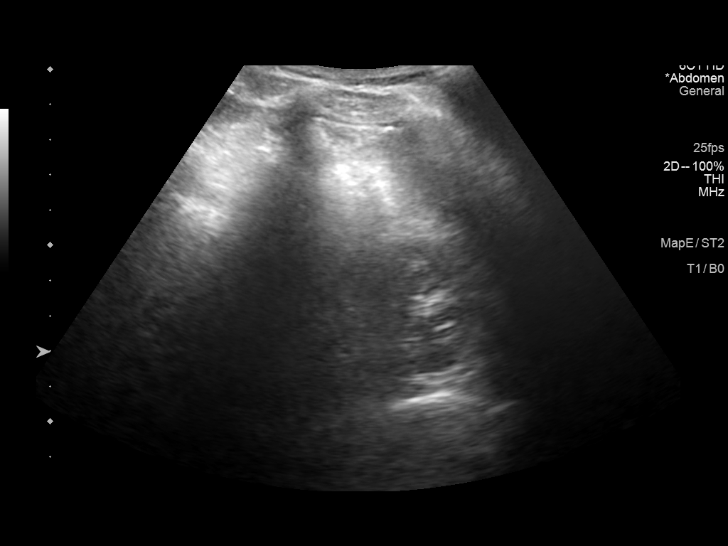
[im 92/92]
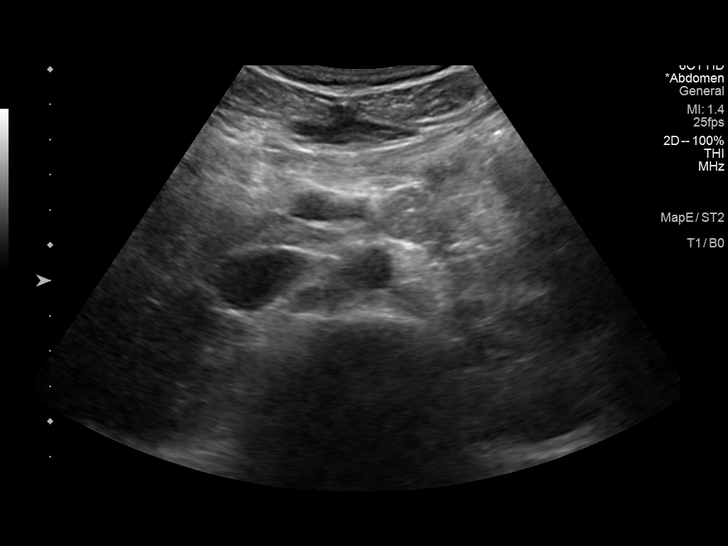

[14 of 25 positions shown; findings below may reference images not displayed]

FINDINGS: Gallbladder: No gallstones or wall thickening visualized. No
sonographic Murphy sign noted by sonographer.

Common bile duct: Diameter: 2.9 mm proximal, 6.6 mm distal.

Liver: No focal lesion identified. Within normal limits in
parenchymal echogenicity. Portal vein is patent on color Doppler
imaging with normal direction of blood flow towards the liver.

IVC: No abnormality visualized.

Pancreas: Visualized portion unremarkable.

Spleen: Size and appearance within normal limits.

Right Kidney: Length: 10 cm. Echogenicity within normal limits. No
mass or hydronephrosis visualized.

Left Kidney: Length: 9.7 cm. Echogenicity within normal limits. No
mass or hydronephrosis visualized.

Abdominal aorta: No aneurysm visualized.

Other findings: None.
IMPRESSION: Stable examination. Upper normal to borderline dilated distal common
bile duct without significant change.

## 2022-08-18 IMAGING — US US PELVIS COMPLETE
1 series · 14 of 25 positions shown · non-contrast
Comparison: None.

CLINICAL DATA: Initial evaluation for pelvic pain for 2 months.

EXAM:
TRANSABDOMINAL ULTRASOUND OF PELVIS
TECHNIQUE: Transabdominal ultrasound examination of the pelvis was performed
including evaluation of the uterus, ovaries, adnexal regions, and
pelvic cul-de-sac.

[Series 1: us pelvis complete · 0.19mm/px · 14 of 57 slices shown]
[im 1/57]
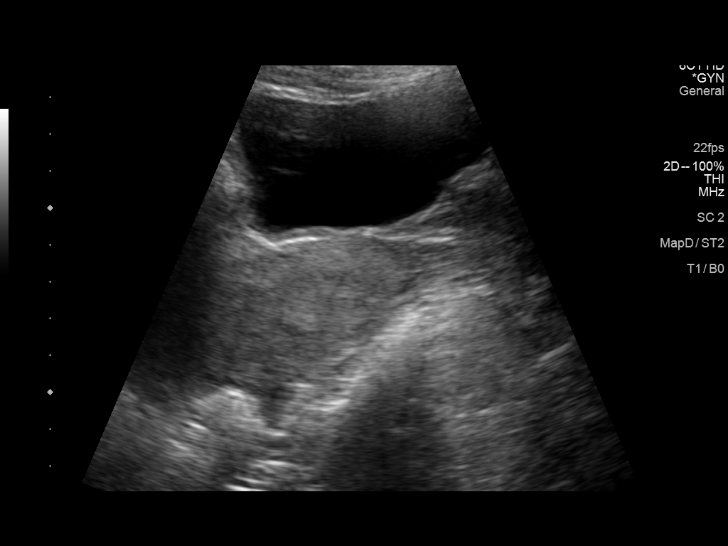
[im 5/57]
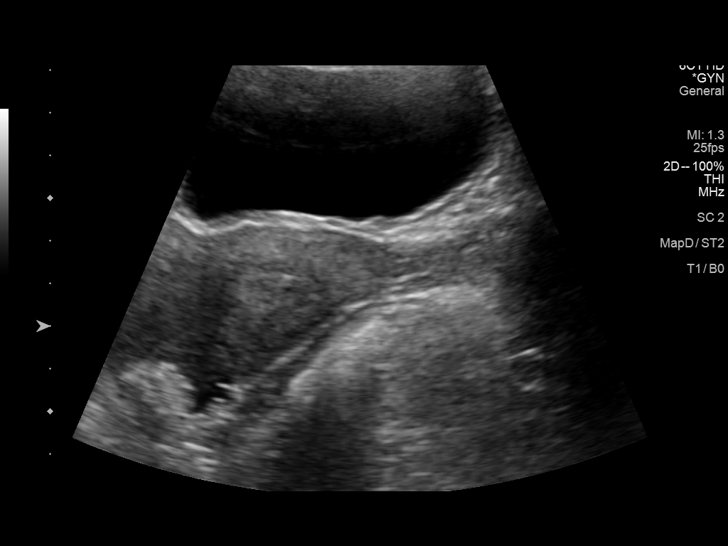
[im 10/57]
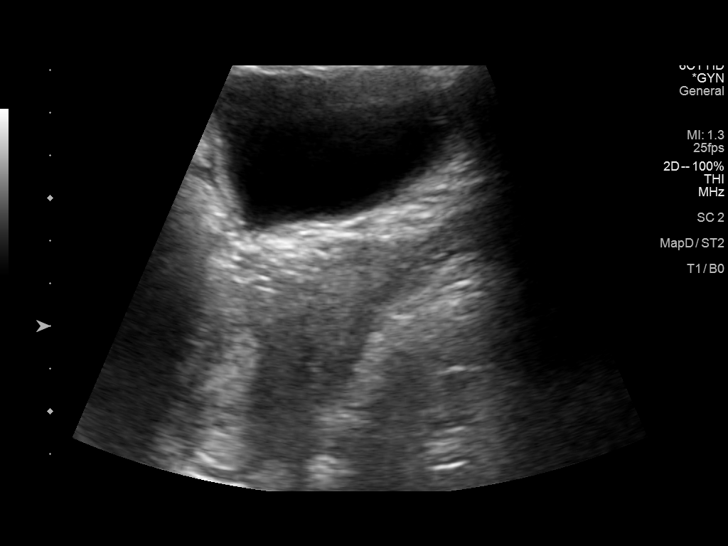
[im 15/57]
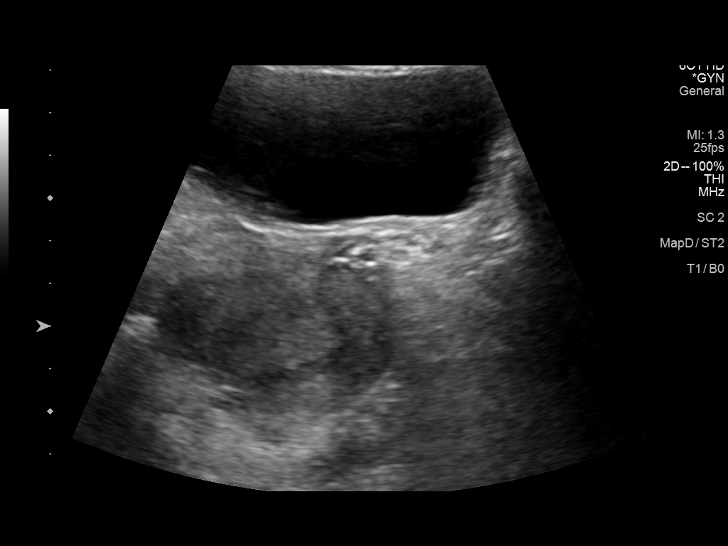
[im 19/57]
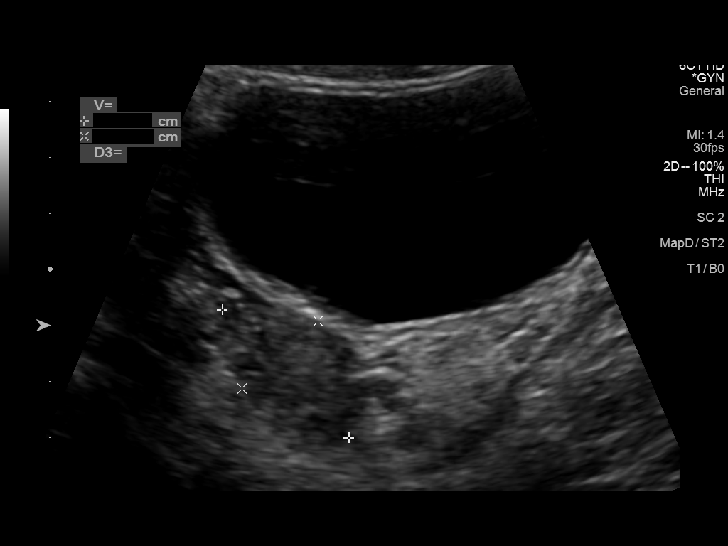
[im 22/57]
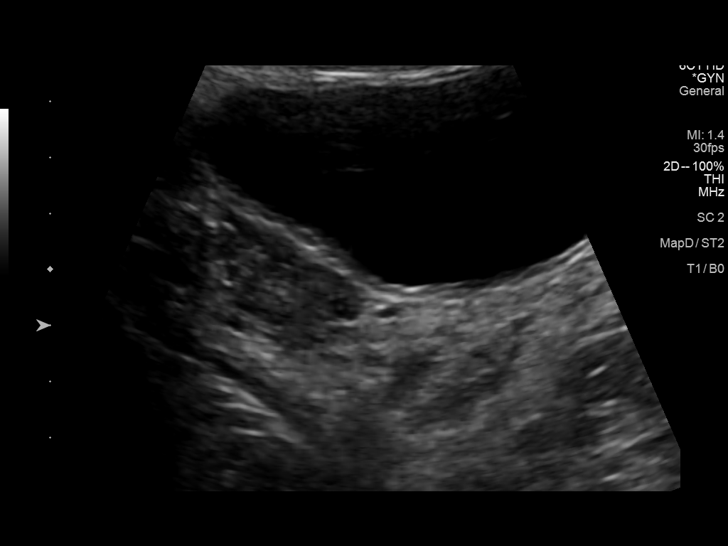
[im 26/57]
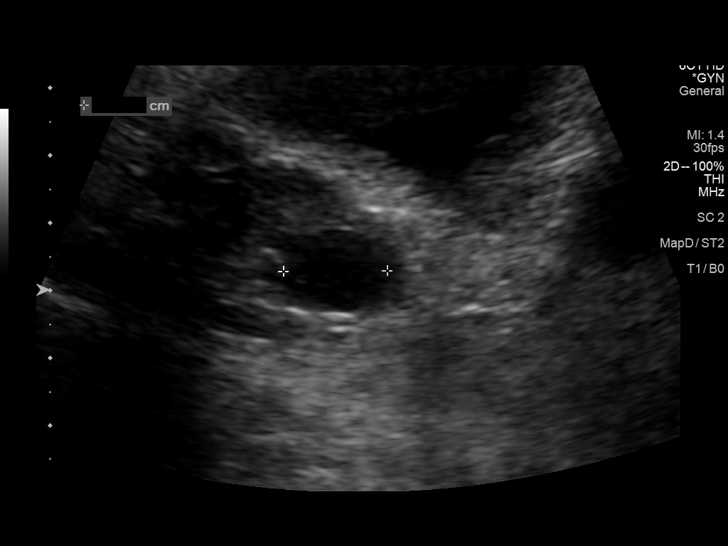
[im 31/57]
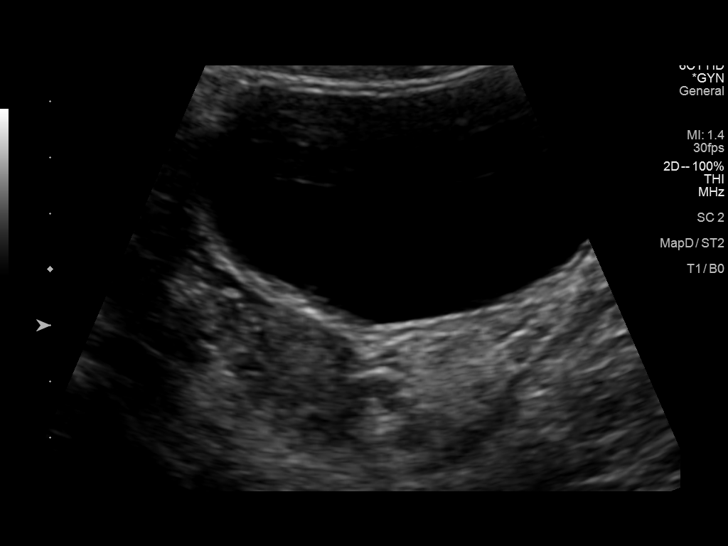
[im 36/57]
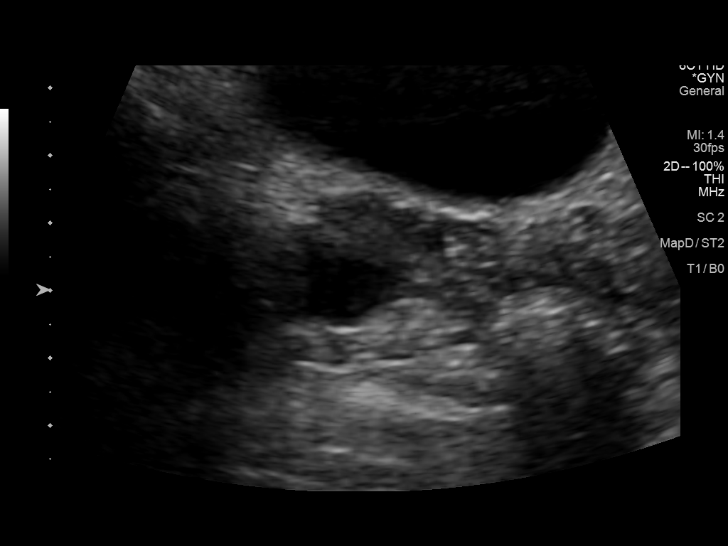
[im 38/57]
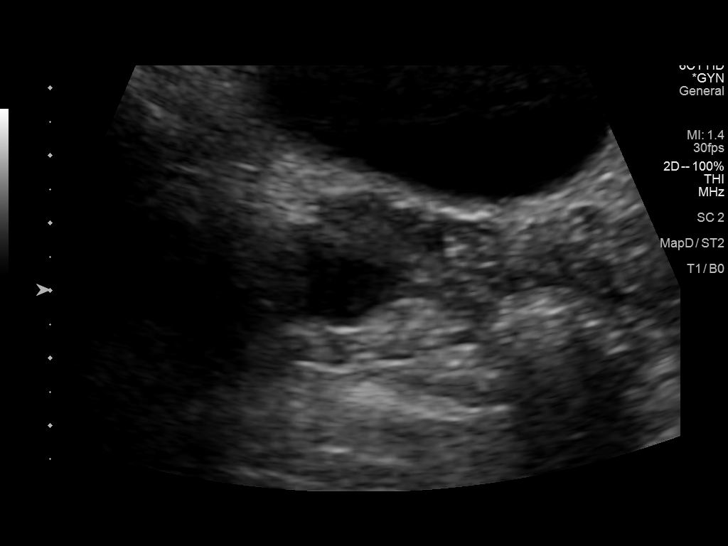
[im 43/57]
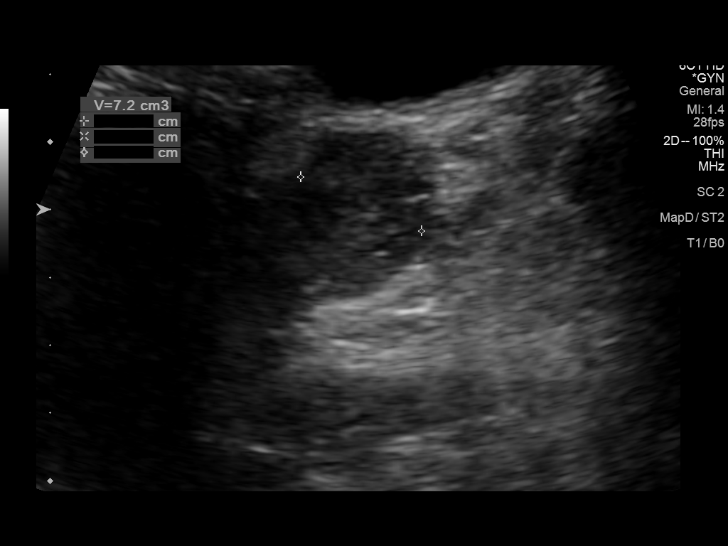
[im 47/57]
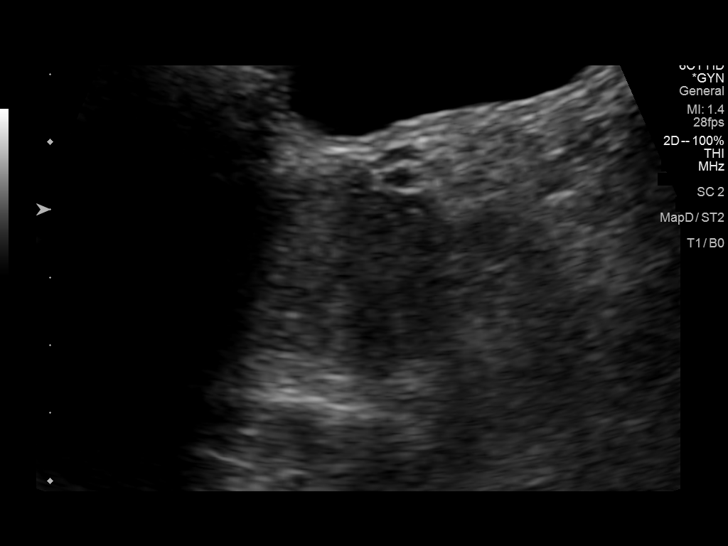
[im 52/57]
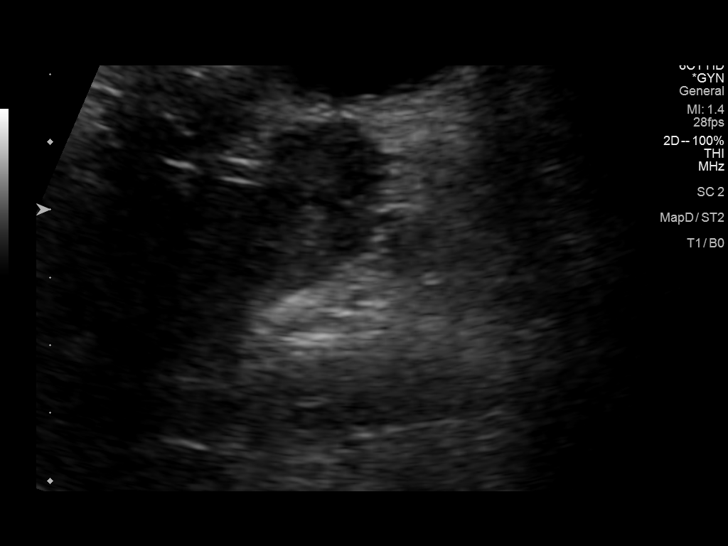
[im 57/57]
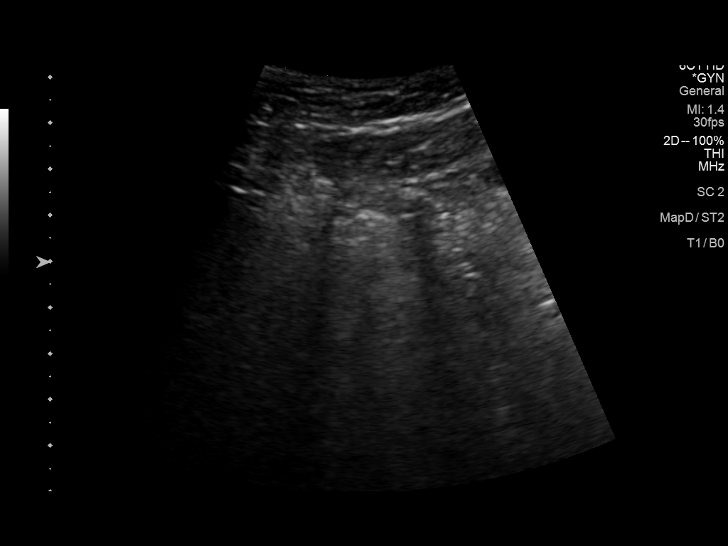

[14 of 25 positions shown; findings below may reference images not displayed]

FINDINGS: Uterus

Measurements: 6.8 x 3.4 x 5.5 cm = volume: 66.5 mL. No fibroids or
other mass visualized.

Endometrium

Thickness: 8.8 mm.  No focal abnormality visualized.

Right ovary

Measurements: 3.2 x 1.8 x 2.0 cm = volume: 6.1 mL. 1.5 x 1.1 x
cm dominant follicle noted. No other adnexal mass.

Left ovary

Measurements: 2.7 x 2.6 x 2.0 cm = volume: 7.2 mL. Normal
appearance/no adnexal mass.

Other findings:  No abnormal free fluid.
IMPRESSION: Normal pelvic ultrasound. No findings to explain patient's symptoms
identified.

## 2022-08-23 ENCOUNTER — Ambulatory Visit
Admission: RE | Admit: 2022-08-23 | Discharge: 2022-08-23 | Disposition: A | Discharge status: Home / Self Care | Payer: 59 | Source: Ambulatory Visit | Attending: Family Medicine | Admitting: Family Medicine

## 2022-08-23 DIAGNOSIS — M7989 Other specified soft tissue disorders: Secondary | ICD-10-CM

## 2023-08-29 ENCOUNTER — Emergency Department (HOSPITAL_BASED_OUTPATIENT_CLINIC_OR_DEPARTMENT_OTHER)
Admission: EM | Admit: 2023-08-29 | Discharge: 2023-08-29 | Disposition: A | Discharge status: Home / Self Care | Attending: Emergency Medicine | Admitting: Emergency Medicine

## 2023-08-29 ENCOUNTER — Other Ambulatory Visit: Payer: Self-pay

## 2023-08-29 ENCOUNTER — Encounter (HOSPITAL_BASED_OUTPATIENT_CLINIC_OR_DEPARTMENT_OTHER): Payer: Self-pay | Admitting: Emergency Medicine

## 2023-08-29 ENCOUNTER — Encounter: Payer: Self-pay | Admitting: Hematology

## 2023-08-29 DIAGNOSIS — S60939A Unspecified superficial injury of unspecified thumb, initial encounter: Secondary | ICD-10-CM | POA: Diagnosis present

## 2023-08-29 DIAGNOSIS — S61209A Unspecified open wound of unspecified finger without damage to nail, initial encounter: Secondary | ICD-10-CM

## 2023-08-29 DIAGNOSIS — W274XXA Contact with kitchen utensil, initial encounter: Secondary | ICD-10-CM | POA: Insufficient documentation

## 2023-08-29 DIAGNOSIS — S61102A Unspecified open wound of left thumb with damage to nail, initial encounter: Secondary | ICD-10-CM | POA: Diagnosis not present

## 2023-08-29 MED ORDER — AMOXICILLIN-POT CLAVULANATE 875-125 MG PO TABS: 1.0000 | ORAL_TABLET | Freq: Two times a day (BID) | ORAL | 0 refills | Status: AC

## 2023-08-29 NOTE — ED Notes (Signed)
 MD at bedside.

## 2023-08-29 NOTE — Discharge Instructions (Addendum)
 Attempt to keep finger dry over the next 48 hours avoid pain and infection.  I have called in a prescription to continue your Augmentin dose for 7 more days.  Given your body weight it is safe to take Motrin 500 mg every 6 hours for pain.  You can also take Tylenol 150 mg every 6 hours for pain.  After this time it is safe to get your finger wet however try to avoid submersion in any water that is high risk for infection this includes pools, hot tubs, lakes, etc.  While at work keep your finger covered with gauze while wearing gloves to avoid excess moisture.  You can use the Xeroform or bacitracin for the its antistick properties.

## 2023-08-29 NOTE — ED Triage Notes (Signed)
 Pt reports LT thumb laceration, possibly partial avulsion pta when using peeler. Bandage applied in triage, bleeding noted

## 2023-08-29 NOTE — ED Provider Notes (Signed)
 [facility] DEPARTMENT AT [facility] Provider Note   CSN: [id number] Arrival date & time: 08/29/23  1748     History  Chief Complaint  Patient presents with   Extremity Laceration    Lori Mccarty is a 33 y.o. female.  Patient is a 33 year old female presenting for injury to the thumb.  Patient states she was cutting a dirty potato covered in maneuver with a new peeler when she took off some of the skin on the distal tip of her left thumb.  She admits to bleeding.  This is a high skin sensitivity and pain.  The history is provided by the patient. No language interpreter was used.       Home Medications Prior to Admission medications   Medication Sig Start Date End Date Taking? Authorizing Provider  amoxicillin-clavulanate (AUGMENTIN) 875-125 MG tablet Take 1 tablet by mouth every 12 (twelve) hours for 7 days. 08/29/23 09/05/23 Yes Edwin Dada P, DO  B Complex-C (B-COMPLEX WITH VITAMIN C) tablet Take 1 tablet by mouth 3 (three) times a week.    [provider]  Cholecalciferol (VITAMIN D3) 125 MCG (5000 UT) TABS Take 5,000 Units by mouth 3 (three) times a week.    [provider]  SODIUM CHLORIDE PO Take by mouth daily at 12 noon.    [provider]  vitamin B-12 (CYANOCOBALAMIN) 500 MCG tablet Take 500 mcg by mouth daily.    [provider]  zolpidem (AMBIEN CR) 12.5 MG CR tablet Take 12.5 mg by mouth at bedtime. 11/28/20   [provider]      Allergies    Tetracycline hcl, Sulfa antibiotics, Dextromethorphan, Dulera [mometasone furo-formoterol fum], Gluten meal, Mestinon [pyridostigmine], and Penicillins    Review of Systems   Review of Systems  Constitutional:  Negative for fever.  Skin:  Positive for wound.  Neurological:  Negative for weakness and numbness.    Physical Exam Updated Vital Signs BP (!) 115/90   Pulse 80   Temp 98.3 F (36.8 C) (Oral)   Resp 18   Wt 49.9 kg   LMP 08/21/2023   SpO2  100%   BMI 20.12 kg/m  Physical Exam Vitals and nursing note reviewed.  Constitutional:      Appearance: Normal appearance.  HENT:     Head: Normocephalic.  Cardiovascular:     Rate and Rhythm: Normal rate.  Pulmonary:     Effort: Pulmonary effort is normal.  Musculoskeletal:       Arms:  Neurological:     Mental Status: She is alert.     ED Results / Procedures / Treatments   Labs (all labs ordered are listed, but only abnormal results are displayed) Labs Reviewed - No data to display  EKG None  Radiology No results found.  Procedures Procedures    Medications Ordered in ED Medications - No data to display  ED Course/ Medical Decision Making/ A&P                                 Medical Decision Making  82:72 PM 33 year old female presenting for injury to finger with a potato peeler.  On exam patient is alert and oriented x 3, no acute distress, stable vital signs.  Physical exam demonstrates distal fingertip avulsion of the dorsal aspect of the left thumb.  Partial nail involvement.  Nailbed is uninvolved.  No muscle involvement.  No ligament involvement.  Minimal  bleeding.  Patient offered nerve block and declined.  Patient offered pain medication and declined.  Syringe pressure wash completed.  Tdap up-to-date.  Due to history of peeler being used on potatoes covered in manure.. Will cover with Augmentin to avoid infection.  Patient neurovascularly intact.  Stable for discharge at this time.  Wound is open and will via secondary intention.  Partial nail amputation.  Signs and symptoms of infection described in detail for follow-up.  Discharged with deep skin avulsion occasional handout.  Patient in no distress and overall condition improved here in the ED. Detailed discussions were had with the patient regarding current findings, and need for close f/u with PCP or on call doctor. The patient has been instructed to return immediately if the symptoms worsen in any way  for re-evaluation. Patient verbalized understanding and is in agreement with current care plan. All questions answered prior to discharge.         Final Clinical Impression(s) / ED Diagnoses Final diagnoses:  Avulsion of fingertip, initial encounter    Rx / DC Orders ED Discharge Orders          Ordered    amoxicillin-clavulanate (AUGMENTIN) 875-125 MG tablet  Every 12 hours        08/29/23 1848              Franne Forts, DO 08/29/23 1853

## 2023-10-12 ENCOUNTER — Encounter (HOSPITAL_BASED_OUTPATIENT_CLINIC_OR_DEPARTMENT_OTHER): Payer: Self-pay | Admitting: Emergency Medicine

## 2023-10-12 ENCOUNTER — Ambulatory Visit (HOSPITAL_BASED_OUTPATIENT_CLINIC_OR_DEPARTMENT_OTHER)
Admission: RE | Admit: 2023-10-12 | Discharge: 2023-10-12 | Disposition: A | Discharge status: Home / Self Care | Source: Ambulatory Visit | Attending: Emergency Medicine | Admitting: Emergency Medicine

## 2023-10-12 ENCOUNTER — Other Ambulatory Visit (HOSPITAL_BASED_OUTPATIENT_CLINIC_OR_DEPARTMENT_OTHER): Payer: Self-pay | Admitting: Emergency Medicine

## 2023-10-12 DIAGNOSIS — M79661 Pain in right lower leg: Secondary | ICD-10-CM

## 2024-03-23 NOTE — Progress Notes (Signed)
 [facility] [address] [place], [place] [address] P: [phone]  F: [phone]  Spine Surgery Clinic Note   Lori Mccarty  01/20/1991  Referred by: Self, Referred, MD  03/23/2024   CC: Back pain  HPI:   Patient is a 33 y.o. female who presents for evaluation of back pain.  She notes that she has had some issues with back pain for years on and off.  Over the last 6 months she has had a flareup and is having a new type of pain.  She describes a particular area in her mid back that is tender to touch and hurts when any pressure is put on it.  This pain does not travel down her legs.  No significant neurologic symptoms in her bilateral lower extremities including no radiating pain, numbness, or weakness.  No recent injuries or falls.  She has not been taking any medications recently.  No recent physical therapy or injections.  She is quite active and enjoys exercising and yoga.    ROS: Review of systems negative unless otherwise noted above in history for constitutional, HEENT, eyes, respiratory, cardiology, GI, endocrine, GU, heme, skin, psychiatric.  Current Outpatient Medications on File Prior to Visit  Medication Sig Dispense Refill   albuterol  108 (90 Base) MCG/ACT inhaler Inhale 2 puffs every 6 (six) hours if needed.     albuterol  108 (90 Base) MCG/ACT inhaler Ventolin  HFA 90 mcg/actuation aerosol inhaler     B Complex-C (b complex-vitamin c) tablet Take by mouth.     budesonide-formoterol  (Symbicort) 80-4.5 MCG/ACT inhaler      carboxymethylcellulose (Refresh Plus) 0.5 % ophthalmic solution Administer into affected eye(s).     Cholecalciferol (Vitamin D3) 125 MCG (5000 UT) tablet Take by mouth.     cyanocobalamin  (Vitamin B-12) 100 MCG tablet 2 tablets     dicyclomine (Bentyl) 10 MG/5ML syrup Take 10 mL (20 mg) by mouth.     Digestive Enzymes (Bevitrol) capsule Take by mouth.     ergocalciferol (Vitamin D-2) 1.25 MG (50000  UT) capsule 1 capsule (1.25 mg).     eszopiclone (Lunesta) 2 MG tablet take 1 tablet by mouth once daily before bedtime     eszopiclone (Lunesta) 3 MG tablet 1 tablet immediately before bedtime     Hydrocod Polst-Chlorphen Polst (Hydrocod Poli-Chlorphe Poli ER) 10-8 MG/5ML Suspension Extended Release TAKE 5 ML BY MOUTH 2 (TWO) TIMES DAILY.     hydrocortisone (Cortef) 5 MG tablet every 12 (twelve) hours.     LACTOBACILLUS PO 1 capsule between meals     methylphenidate (Ritalin) 20 MG tablet Take 1 tab by mouth q 9 am and 1 pm     midodrine (Proamatine) 2.5 MG tablet      Misc. Devices misc MRI Brain and C-spine with and without contrast, R25.1, R53.83, G90.9, Z89.828     ondansetron  (Zofran ) 4 MG tablet      pyridostigmine (Mestinon) 60 MG tablet      sodium chloride  1 g tablet Take by mouth See administration instructions.     traZODone (Desyrel) 150 MG tablet 1 tablet     valACYclovir (Valtrex) 500 MG tablet Take 1 mg by mouth in the morning and at bedtime.     zolpidem CR (Ambien CR) 12.5 MG ER tablet Take 1 tablet (12.5 mg) by mouth.     No current facility-administered medications on file prior to visit.      Allergies as of 03/23/2024 - Reviewed 03/23/2024  Allergen Reaction  Noted   Dextromethorphan Other and Unknown 05/16/2021   Doxycycline Other 04/19/2022   Other Swelling, Rash, and Other 12/06/2010   Sulfa antibiotics Swelling and Rash 12/06/2010   Tetracycline Shortness of breath, Nausea Only, and GI intolerance 01/11/2021   Gluten meal Other 04/15/2018   Mometasone  furo-formoterol  fum Other 04/15/2018   Penicillins Other and Rash 11/20/2007   Pyridostigmine Other 09/21/2019   Sulfamethoxazole-trimethoprim Rash 11/20/2007   Sulfasalazine Swelling 10/13/2015     History reviewed. No pertinent past medical history.   Past Surgical History:  Procedure Laterality Date   GALLBLADDER SURGERY N/A      Social History   Socioeconomic History    Marital status: Single    Spouse name: Not on file   Number of children: Not on file   Years of education: Not on file   Highest education level: Not on file  Occupational History   Not on file  Tobacco Use   Smoking status: Never   Smokeless tobacco: Never  Vaping Use   Vaping status: Never Used  Substance and Sexual Activity   Alcohol use: Never   Drug use: Never   Sexual activity: Not on file  Other Topics Concern   Not on file  Social History Narrative   Not on file   Social Drivers of Health   Financial Resource Strain: Medium Risk (05/08/2021)   Received from [facility]   Overall Financial Resource Strain (CARDIA)    Difficulty of Paying Living Expenses: Somewhat hard  Food Insecurity: No Food Insecurity (06/12/2021)   Received from [facility]   Hunger Vital Sign    Within the past 12 months, you worried that your food would run out before you got the money to buy more.: Never true    Within the past 12 months, the food you bought just didn't last and you didn't have money to get more.: Never true  Transportation Needs: No Transportation Needs (05/08/2021)   Received from [facility] - Transportation    Lack of Transportation (Medical): No    Lack of Transportation (Non-Medical): No  Physical Activity: Insufficiently Active (05/08/2021)   Received from [facility]   Exercise Vital Sign    On average, how many days per week do you engage in moderate to strenuous exercise (like a brisk walk)?: 1 day    On average, how many minutes do you engage in exercise at this level?: 20 min  Stress: Stress Concern Present (05/08/2021)   Received from [facility] - Occupational Stress Questionnaire    Feeling of Stress : To some extent  Social Connections: Socially Integrated (05/08/2021)   Received from [facility]   Social Connection and Isolation Panel    In a typical week, how many times  do you talk on the phone with family, friends, or neighbors?: More than three times a week    How often do you get together with friends or relatives?: Once a week    How often do you attend church or religious services?: More than 4 times per year    Do you belong to any clubs or organizations such as church groups, unions, fraternal or athletic groups, or school groups?: No    How often do you attend meetings of the clubs or organizations you belong to?: 1 to 4 times per year    Are you married, widowed, divorced, separated, never married, or living with a partner?: Living with partner  Intimate Partner Violence: Unknown (05/08/2021)   Received from [facility]   Humiliation, Afraid, Rape, and Kick questionnaire    Within the last year, have you been afraid of your partner or ex-partner?: No    Within the last year, have you been humiliated or emotionally abused in other ways by your partner or ex-partner?: Patient declined    Within the last year, have you been kicked, hit, slapped, or otherwise physically hurt by your partner or ex-partner?: No    Within the last year, have you been raped or forced to have any kind of sexual activity by your partner or ex-partner?: No  Housing Stability: Low Risk  (05/08/2021)   Received from [facility] Stability Vital Sign    Unable to Pay for Housing in the Last Year: No    Number of Places Lived in the Last Year: 1    Unstable Housing in the Last Year: No     Family History  Problem Relation Name Age of Onset   Hypertension Mother     Hypertension Father     Psoriasis Father           Visit Vitals Ht 5' 2 (1.575 m)  Wt 110 lb (49.9 kg)  BMI 20.12 kg/m  Smoking Status Never  BSA 1.48 m       Physical Exam:   GENERAL  Alert, Oriented, in no apparent distress.  Extra-ocular movements intact, pupils reactive  Easy work of breathing on Room Air  No peripheral edema  Abdomen soft, non-distended  Warm  and well perfused digits with 2+ DP pulses bilaterally, no dermatologic changes suggestive of peripheral vascular disease (loss of hair, shiny skin, lipodermatosclerosis)   NEUROLOGIC EXAM:  Gait is normal Muscle Atrophy:  None   THORACOLUMBAR  Examination of the lumbar spine demonstrates no prior surgical incisions. There is a pinpoint area of tenderness to palpation over an upper lumbar spinous process  Lumbar range of motion is within normal limits for flexion, extension, lateral bending, and rotation.   Sensation is intact to light touch from L2-S1 nerve root distributions   Motor LE   IP Quad TA EHL GS  Right 5 5 5 5 5   Left 5 5 5 5 5      Labs:  No results for input(s): WBC, HGB, HCT, PLT, NA, K, BUN, GLU, INR in the last 72 hours.  No lab exists for component: CRE    Imaging:    X-ray thoracic spine AP and lateral 03/23/2024 demonstrates No significant degenerative changes within the thoracic spine.  Overall relatively normal alignment with no overt fracture or instability.  X-ray lumbar spine AP and lateral 03/23/2024 demonstrates No significant degenerative changes within the lumbar spine.  Overall relatively normal alignment with no overt fracture or instability.  Assessment/Plan:    Patient is a 33 y.o. female with axial low back pain.  We discussed axial back pain, back strains, degenerative disc disease, and lumbar spondylosis. We discussed the role of conservative cares in treating back pain. This includes activity modification, physical therapy, medications including NSAIDs for flares, and injections. We discussed the relapsing nature of back pain. We discussed a spine-healthy lifestyle including a healthy diet, weight control, staying active, and maintaining core strength, stability, and lower extremity flexibility. We discussed that there is generally not a good surgical treatment for axial back pain unless it is associated with a  mechanical/structural abnormality, radicular leg pain, weakness, or other neurologic signs/symptoms.   Today  I placed a referral to physical therapy for her lower back and core strengthening.  She will follow-up with us  as needed if her symptoms do not improve.   Waddell Lager, MD Orthopaedic Spine Surgeon [facility]  ------------------------------------------------   Patient was counseled to go to the emergency room if any concerning symptoms develop, including but not limited to worsening pain, numbness, weakness, bowel or bladder problems, difficulty with balance or gait, or any other concerning symptoms. ?Patient asked thoughtful questions and voiced back understanding of these issues that would require urgent or emergent attention.??   This note was generated with a voice recognition program. Please excuse any errors which may have been overlooked during my review of this note. Sometimes, these errors may affect the content or meaning of a given sentence.

## 2024-05-05 ENCOUNTER — Emergency Department (HOSPITAL_BASED_OUTPATIENT_CLINIC_OR_DEPARTMENT_OTHER)
Admission: EM | Admit: 2024-05-05 | Discharge: 2024-05-05 | Disposition: A | Discharge status: Home / Self Care | Attending: Emergency Medicine | Admitting: Emergency Medicine

## 2024-05-05 ENCOUNTER — Emergency Department (HOSPITAL_BASED_OUTPATIENT_CLINIC_OR_DEPARTMENT_OTHER)

## 2024-05-05 ENCOUNTER — Encounter (HOSPITAL_BASED_OUTPATIENT_CLINIC_OR_DEPARTMENT_OTHER): Payer: Self-pay

## 2024-05-05 ENCOUNTER — Other Ambulatory Visit: Payer: Self-pay

## 2024-05-05 DIAGNOSIS — R112 Nausea with vomiting, unspecified: Secondary | ICD-10-CM | POA: Diagnosis not present

## 2024-05-05 DIAGNOSIS — R1084 Generalized abdominal pain: Secondary | ICD-10-CM

## 2024-05-05 DIAGNOSIS — R197 Diarrhea, unspecified: Secondary | ICD-10-CM | POA: Diagnosis not present

## 2024-05-05 DIAGNOSIS — R109 Unspecified abdominal pain: Secondary | ICD-10-CM | POA: Diagnosis present

## 2024-05-05 LAB — URINALYSIS, ROUTINE W REFLEX MICROSCOPIC
Bilirubin Urine: NEGATIVE
Glucose, UA: NEGATIVE mg/dL
Hgb urine dipstick: NEGATIVE
Ketones, ur: 15 mg/dL — AB
Leukocytes,Ua: NEGATIVE
Nitrite: NEGATIVE
Protein, ur: NEGATIVE mg/dL
Specific Gravity, Urine: 1.012 (ref 1.005–1.030)
pH: 7.5 (ref 5.0–8.0)

## 2024-05-05 LAB — COMPREHENSIVE METABOLIC PANEL WITH GFR
ALT: 11 U/L (ref 0–44)
AST: 20 U/L (ref 15–41)
Albumin: 4.5 g/dL (ref 3.5–5.0)
Alkaline Phosphatase: 74 U/L (ref 38–126)
Anion gap: 15 (ref 5–15)
BUN: 15 mg/dL (ref 6–20)
CO2: 21 mmol/L — ABNORMAL LOW (ref 22–32)
Calcium: 9.6 mg/dL (ref 8.9–10.3)
Chloride: 103 mmol/L (ref 98–111)
Creatinine, Ser: 0.77 mg/dL (ref 0.44–1.00)
GFR, Estimated: >60 mL/min (ref >60)
Glucose, Bld: 111 mg/dL — ABNORMAL HIGH (ref 70–99)
Potassium: 4.4 mmol/L (ref 3.5–5.1)
Sodium: 139 mmol/L (ref 135–145)
Total Bilirubin: 1 mg/dL (ref 0.0–1.2)
Total Protein: 7.3 g/dL (ref 6.5–8.1)

## 2024-05-05 LAB — LIPASE, BLOOD: Lipase: 24 U/L (ref 11–51)

## 2024-05-05 LAB — HCG, SERUM, QUALITATIVE: Preg, Serum: NEGATIVE

## 2024-05-05 LAB — CBC
HCT: 43.9 % (ref 36.0–46.0)
Hemoglobin: 14.9 g/dL (ref 12.0–15.0)
MCH: 30.4 pg (ref 26.0–34.0)
MCHC: 33.9 g/dL (ref 30.0–36.0)
MCV: 89.6 fL (ref 80.0–100.0)
Platelets: 268 K/uL (ref 150–400)
RBC: 4.9 MIL/uL (ref 3.87–5.11)
RDW: 13.2 % (ref 11.5–15.5)
WBC: 11.8 K/uL — ABNORMAL HIGH (ref 4.0–10.5)
nRBC: 0 % (ref 0.0–0.2)

## 2024-05-05 MED ORDER — LACTATED RINGERS IV BOLUS
1000.0000 mL | Freq: Once | INTRAVENOUS | Status: AC
  Administered 2024-05-05: 09:00:00 1000 mL via INTRAVENOUS

## 2024-05-05 MED ORDER — PROMETHAZINE HCL 25 MG RE SUPP: 25.0000 mg | Freq: Four times a day (QID) | RECTAL | 0 refills | Status: AC | PRN

## 2024-05-05 MED ORDER — METOCLOPRAMIDE HCL 5 MG/ML IJ SOLN
5.0000 mg | Freq: Once | INTRAMUSCULAR | Status: AC
  Administered 2024-05-05: 08:00:00 5 mg via INTRAVENOUS
  Filled 2024-05-05: qty 2

## 2024-05-05 MED ORDER — METOCLOPRAMIDE HCL 10 MG PO TABS: 10.0000 mg | ORAL_TABLET | Freq: Three times a day (TID) | ORAL | 0 refills | Status: AC | PRN

## 2024-05-05 MED ORDER — ACETAMINOPHEN 325 MG PO TABS: 650.0000 mg | ORAL_TABLET | Freq: Once | ORAL | Status: DC

## 2024-05-05 MED ORDER — PANTOPRAZOLE SODIUM 40 MG IV SOLR
40.0000 mg | Freq: Once | INTRAVENOUS | Status: AC
  Administered 2024-05-05: 04:00:00 20 mg via INTRAVENOUS
  Filled 2024-05-05: qty 10

## 2024-05-05 MED ORDER — IOHEXOL 300 MG/ML  SOLN
100.0000 mL | Freq: Once | INTRAMUSCULAR | Status: AC | PRN
  Administered 2024-05-05: 07:00:00 75 mL via INTRAVENOUS

## 2024-05-05 MED ORDER — ONDANSETRON HCL 4 MG/2ML IJ SOLN
2.0000 mg | Freq: Once | INTRAMUSCULAR | Status: AC
  Administered 2024-05-05: 03:00:00 2 mg via INTRAVENOUS

## 2024-05-05 MED ORDER — SODIUM CHLORIDE 0.9 % IV BOLUS
1000.0000 mL | Freq: Once | INTRAVENOUS | Status: AC
  Administered 2024-05-05: 04:00:00 1000 mL via INTRAVENOUS

## 2024-05-05 MED ORDER — DEXAMETHASONE SODIUM PHOSPHATE 4 MG/ML IJ SOLN
4.0000 mg | Freq: Once | INTRAMUSCULAR | Status: AC
  Administered 2024-05-05: 06:00:00 4 mg via INTRAVENOUS
  Filled 2024-05-05: qty 1

## 2024-05-05 MED ORDER — ONDANSETRON HCL 4 MG/2ML IJ SOLN
4.0000 mg | Freq: Once | INTRAMUSCULAR | Status: DC
  Filled 2024-05-05: qty 2

## 2024-05-05 MED ORDER — ONDANSETRON HCL 4 MG/2ML IJ SOLN
4.0000 mg | Freq: Once | INTRAMUSCULAR | Status: AC
  Administered 2024-05-05: 04:00:00 2 mg via INTRAVENOUS
  Filled 2024-05-05: qty 2

## 2024-05-05 NOTE — Discharge Instructions (Signed)
 Please try to maintain adequate hydration.  Please follow-up with your primary doctor.  Return for fevers, chills, worsening abdominal pain, inability to eat or drink with nausea vomiting, or any new or worsening symptoms that are concerning to you.

## 2024-05-05 NOTE — ED Provider Notes (Signed)
 [place] EMERGENCY DEPARTMENT AT [facility] Provider Note   CSN: [id number] Arrival date & time: 05/05/24  0245     Patient presents with: Abdominal Pain   Lori Mccarty is a 33 y.o. female.   The history is provided by the patient and a significant other.  Abdominal Pain Associated symptoms: nausea and vomiting   Associated symptoms: no fever   Patient presents for multiple issues.  Patient recently traveled to [place].  She reports at that time she had an upper respiratory infection.  Starting yesterday she started having abdominal pain, chest burning and nausea and vomiting with nonbloody emesis She has had these episodes before and she gets significant dehydration Patient has history of idiopathic autonomic symptoms and had similar episodes in the past.  She has had a previous cholecystectomy Patient reports that fluids, Zofran  and Tylenol  typically improve her symptoms    She did not have any GI symptoms while traveling Prior to Admission medications  Medication Sig Start Date End Date Taking? Authorizing Provider  B Complex-C (B-COMPLEX WITH VITAMIN C) tablet Take 1 tablet by mouth 3 (three) times a week.    [provider]  Cholecalciferol (VITAMIN D3) 125 MCG (5000 UT) TABS Take 5,000 Units by mouth 3 (three) times a week.    [provider]  SODIUM CHLORIDE  PO Take by mouth daily at 12 noon.    [provider]  vitamin B-12 (CYANOCOBALAMIN ) 500 MCG tablet Take 500 mcg by mouth daily.    [provider]  zolpidem (AMBIEN CR) 12.5 MG CR tablet Take 12.5 mg by mouth at bedtime. 11/28/20   [provider]    Allergies: Tetracycline hcl, Sulfa antibiotics, Dextromethorphan, Dulera [mometasone  furo-formoterol  fum], Gluten meal, Mestinon [pyridostigmine], and Penicillins    Review of Systems  Constitutional:  Negative for fever.  Gastrointestinal:  Positive for abdominal pain, nausea and vomiting.     Updated Vital Signs BP 106/80   Pulse 79   Temp 98.1 F (36.7 C)   Resp 20   LMP 04/03/2024 (Approximate)   SpO2 100%   Physical Exam CONSTITUTIONAL: Well developed/well nourished, anxious and uncomfortable. HEAD: Normocephalic/atraumatic EYES: EOMI/PERRL, no icterus ENMT: Mucous membranes moist NECK: supple no meningeal signs CV: S1/S2 noted, no murmurs/rubs/gallops noted LUNGS: Lungs are clear to auscultation bilaterally, no apparent distress ABDOMEN: soft, mild diffuse tenderness, no rebound or guarding, bowel sounds noted throughout abdomen GU:no cva tenderness NEURO: Pt is awake/alert/appropriate, moves all extremitiesx4.  No facial droop.   EXTREMITIES: And contractures noted SKIN: warm, color normal PSYCH: Anxious  (all labs ordered are listed, but only abnormal results are displayed) Labs Reviewed  COMPREHENSIVE METABOLIC PANEL WITH GFR - Abnormal; Notable for the following components:      Result Value   CO2 21 (*)    Glucose, Bld 111 (*)    All other components within normal limits  CBC - Abnormal; Notable for the following components:   WBC 11.8 (*)    All other components within normal limits  URINALYSIS, ROUTINE W REFLEX MICROSCOPIC - Abnormal; Notable for the following components:   Ketones, ur 15 (*)    All other components within normal limits  GASTROINTESTINAL PANEL BY PCR, STOOL (REPLACES STOOL CULTURE)  C DIFFICILE QUICK SCREEN W PCR REFLEX    LIPASE, BLOOD  HCG, SERUM, QUALITATIVE    EKG: EKG Interpretation Date/Time:  Tuesday May 05 2024 04:51:15 EST Ventricular Rate:  82 PR Interval:  118 QRS Duration:  94 QT Interval:  406 QTC Calculation: 475 R Axis:   103  Text Interpretation: Sinus or ectopic atrial rhythm Borderline short PR interval Consider right ventricular hypertrophy Probable anteroseptal infarct, old No previous ECGs available Confirmed by Midge Golas ([id number]) on 05/05/2024 5:10:32 AM  Radiology: No results  found.   Procedures   Medications Ordered in the ED  acetaminophen  (TYLENOL ) tablet 650 mg (0 mg Oral Hold 05/05/24 0628)  ondansetron  (ZOFRAN ) injection 2 mg (2 mg Intravenous Given 05/05/24 0316)  sodium chloride  0.9 % bolus 1,000 mL (0 mLs Intravenous Stopped 05/05/24 0530)  ondansetron  (ZOFRAN ) injection 4 mg (2 mg Intravenous Given 05/05/24 0424)  pantoprazole  (PROTONIX ) injection 40 mg (20 mg Intravenous Given 05/05/24 0427)  dexamethasone  (DECADRON ) injection 4 mg (4 mg Intravenous Given 05/05/24 0554)    Clinical Course as of 05/05/24 0707  Tue May 05, 2024  0533 Patient presents with abdominal pain, burning and vomiting which she has had previously but worse than usual Patient has multiple drug allergies and has had significant adverse effects to multiple drugs particularly those with serotonin properties She has only taken 4 mg of Zofran  and 20 of Protonix .  She does not want any narcotic pain medications.  She has been ambulatory but is now bent over in pain reports nausea She suspects that a dose of Decadron  may help [DW]  [phone] Patient reports she is worsening but refusing all further medications.  Now she reports she is having watery diarrhea.  She is requesting stool samples.  Patient is on her knees on the bed writhing in pain. Will need to proceed with CT abdomen pelvis to ensure there is no other acute abdominal emergency. [DW]  0707 Signed out to dr young at shift change [DW]    Clinical Course User Index [DW] Midge Golas, MD                                 Medical Decision Making Amount and/or Complexity of Data Reviewed Labs: ordered. Radiology: ordered. ECG/medicine tests: ordered.  Risk OTC drugs. Prescription drug management.   This patient presents to the ED for concern of abdominal pain and vomiting, this involves an extensive number of treatment options, and is a complaint that carries with it a high risk of complications and morbidity.  The  differential diagnosis includes but is not limited to c pancreatitis, gastritis, peptic ulcer disease, appendicitis, bowel obstruction, bowel perforation, diverticulitis, ectopic pregnancy   Comorbidities that complicate the patient evaluation: Patients presentation is complicated by their history of idiopathic autonomic dysfunction  Social Determinants of Health: Patients recent foreign travel  increases the complexity of managing their presentation  Additional history obtained: Additional history obtained from significant other Records reviewed Care Everywhere/External Records  Lab Tests: I Ordered, and personally interpreted labs.  The pertinent results include: Mild dehydration  Imaging Studies ordered: I ordered imaging studies including CT scan abdomen pelvis    Cardiac Monitoring: The patient was maintained on a cardiac monitor.  I personally viewed and interpreted the cardiac monitor which showed an underlying rhythm of:  sinus rhythm  Medicines ordered and prescription drug management: I ordered medication including Zofran  and IV fluids for nausea and dehydration Reevaluation of the patient after these medicines showed that the patient    stayed the same  Reevaluation: After the interventions noted above, I reevaluated the patient and found that they have :worsened  Complexity of problems addressed: Patients presentation is most consistent  with  acute presentation with potential threat to life or bodily function        Final diagnoses:  Generalized abdominal pain  Diarrhea, unspecified type    ED Discharge Orders     None          Midge Golas, MD 05/05/24 [phone]

## 2024-05-05 NOTE — ED Triage Notes (Signed)
 Pt has abdominal pain and burning accompanied by nausea and vomiting x 6 hours.

## 2024-06-26 ENCOUNTER — Other Ambulatory Visit: Payer: Self-pay

## 2024-06-26 DIAGNOSIS — N6341 Unspecified lump in right breast, subareolar: Secondary | ICD-10-CM

## 2024-06-26 DIAGNOSIS — N6325 Unspecified lump in the left breast, overlapping quadrants: Secondary | ICD-10-CM
